# Patient Record
Sex: Female | Born: 1960 | Hispanic: No | Marital: Single | Smoking: Current every day smoker
Health system: Southern US, Community
[De-identification: ages and names within clinical notes are randomized; demographics above are authoritative.]

## PROBLEM LIST (undated history)

## (undated) DIAGNOSIS — E119 Type 2 diabetes mellitus without complications: Secondary | ICD-10-CM

## (undated) DIAGNOSIS — I639 Cerebral infarction, unspecified: Secondary | ICD-10-CM

## (undated) DIAGNOSIS — M549 Dorsalgia, unspecified: Secondary | ICD-10-CM

## (undated) DIAGNOSIS — K219 Gastro-esophageal reflux disease without esophagitis: Secondary | ICD-10-CM

## (undated) DIAGNOSIS — E7800 Pure hypercholesterolemia, unspecified: Secondary | ICD-10-CM

## (undated) DIAGNOSIS — I10 Essential (primary) hypertension: Secondary | ICD-10-CM

## (undated) DIAGNOSIS — G4730 Sleep apnea, unspecified: Secondary | ICD-10-CM

## (undated) DIAGNOSIS — E669 Obesity, unspecified: Secondary | ICD-10-CM

## (undated) DIAGNOSIS — G40909 Epilepsy, unspecified, not intractable, without status epilepticus: Secondary | ICD-10-CM

## (undated) HISTORY — PX: CARPAL TUNNEL RELEASE: SHX101

## (undated) HISTORY — PX: APPENDECTOMY: SHX54

## (undated) HISTORY — PX: BREAST REDUCTION SURGERY: SHX8

---

## 2003-12-15 ENCOUNTER — Emergency Department (HOSPITAL_COMMUNITY): Admission: EM | Admit: 2003-12-15 | Discharge: 2003-12-15 | Payer: Self-pay | Admitting: Emergency Medicine

## 2012-03-26 ENCOUNTER — Emergency Department (HOSPITAL_COMMUNITY)
Admission: EM | Admit: 2012-03-26 | Discharge: 2012-03-26 | Disposition: A | Discharge status: Home / Self Care | Payer: Self-pay | Attending: Emergency Medicine | Admitting: Emergency Medicine

## 2012-03-26 ENCOUNTER — Emergency Department (HOSPITAL_COMMUNITY): Payer: Self-pay

## 2012-03-26 ENCOUNTER — Encounter (HOSPITAL_COMMUNITY): Payer: Self-pay | Admitting: Emergency Medicine

## 2012-03-26 DIAGNOSIS — F17200 Nicotine dependence, unspecified, uncomplicated: Secondary | ICD-10-CM | POA: Insufficient documentation

## 2012-03-26 DIAGNOSIS — M549 Dorsalgia, unspecified: Secondary | ICD-10-CM | POA: Insufficient documentation

## 2012-03-26 DIAGNOSIS — I10 Essential (primary) hypertension: Secondary | ICD-10-CM | POA: Insufficient documentation

## 2012-03-26 DIAGNOSIS — R079 Chest pain, unspecified: Secondary | ICD-10-CM | POA: Insufficient documentation

## 2012-03-26 DIAGNOSIS — R091 Pleurisy: Secondary | ICD-10-CM | POA: Insufficient documentation

## 2012-03-26 HISTORY — DX: Gastro-esophageal reflux disease without esophagitis: K21.9

## 2012-03-26 HISTORY — DX: Essential (primary) hypertension: I10

## 2012-03-26 HISTORY — DX: Epilepsy, unspecified, not intractable, without status epilepticus: G40.909

## 2012-03-26 LAB — CBC WITH DIFFERENTIAL/PLATELET
Basophils Absolute: 0 10*3/uL (ref 0.0–0.1)
Basophils Relative: 0 % (ref 0–1)
Eosinophils Relative: 3 % (ref 0–5)
Hemoglobin: 11.6 g/dL — ABNORMAL LOW (ref 12.0–15.0)
Lymphocytes Relative: 32 % (ref 12–46)
Lymphs Abs: 2.8 10*3/uL (ref 0.7–4.0)
Monocytes Absolute: 0.7 10*3/uL (ref 0.1–1.0)
Neutro Abs: 5 10*3/uL (ref 1.7–7.7)
Neutrophils Relative %: 57 % (ref 43–77)
RDW: 19.1 % — ABNORMAL HIGH (ref 11.5–15.5)

## 2012-03-26 LAB — COMPREHENSIVE METABOLIC PANEL
ALT: 17 U/L (ref 0–35)
AST: 17 U/L (ref 0–37)
Albumin: 3.6 g/dL (ref 3.5–5.2)
CO2: 23 mEq/L (ref 19–32)
Calcium: 8.6 mg/dL (ref 8.4–10.5)
Creatinine, Ser: 0.6 mg/dL (ref 0.50–1.10)
Glucose, Bld: 92 mg/dL (ref 70–99)
Potassium: 4.2 mEq/L (ref 3.5–5.1)

## 2012-03-26 LAB — URINE MICROSCOPIC-ADD ON

## 2012-03-26 LAB — URINALYSIS, ROUTINE W REFLEX MICROSCOPIC
Ketones, ur: NEGATIVE mg/dL
Nitrite: NEGATIVE
Protein, ur: 100 mg/dL — AB
pH: 6 (ref 5.0–8.0)

## 2012-03-26 LAB — LIPASE, BLOOD: Lipase: 20 U/L (ref 11–59)

## 2012-03-26 LAB — POCT PREGNANCY, URINE: Preg Test, Ur: NEGATIVE

## 2012-03-26 MED ORDER — HYDROMORPHONE HCL PF 1 MG/ML IJ SOLN
1.0000 mg | INTRAMUSCULAR | Status: DC | PRN
  Administered 2012-03-26 (×2): 1 mg via INTRAVENOUS
  Filled 2012-03-26 (×2): qty 1

## 2012-03-26 MED ORDER — SODIUM CHLORIDE 0.9 % IV SOLN
1000.0000 mL | Freq: Once | INTRAVENOUS | Status: AC
  Administered 2012-03-26: 1000 mL via INTRAVENOUS

## 2012-03-26 MED ORDER — IOHEXOL 350 MG/ML SOLN
100.0000 mL | Freq: Once | INTRAVENOUS | Status: AC | PRN
  Administered 2012-03-26: 100 mL via INTRAVENOUS

## 2012-03-26 MED ORDER — SODIUM CHLORIDE 0.9 % IV SOLN
1000.0000 mL | INTRAVENOUS | Status: DC
  Administered 2012-03-26: 1000 mL via INTRAVENOUS

## 2012-03-26 MED ORDER — HYDROCODONE-ACETAMINOPHEN 5-325 MG PO TABS: 1.0000 | ORAL_TABLET | Freq: Four times a day (QID) | ORAL | Status: DC | PRN

## 2012-03-26 MED ORDER — PANTOPRAZOLE SODIUM 40 MG PO TBEC: 40.0000 mg | DELAYED_RELEASE_TABLET | Freq: Every day | ORAL | Status: DC

## 2012-03-26 MED ORDER — ONDANSETRON HCL 4 MG/2ML IJ SOLN
4.0000 mg | Freq: Once | INTRAMUSCULAR | Status: AC
  Administered 2012-03-26: 4 mg via INTRAVENOUS
  Filled 2012-03-26: qty 2

## 2012-03-26 NOTE — ED Notes (Signed)
IV team paged and responded to start IV for pt for CT Angio.

## 2012-03-26 NOTE — ED Notes (Signed)
Ultrasound at bedside with pt NAD noted at this time.

## 2012-03-26 NOTE — ED Notes (Signed)
Pt noted to be resting quietly in bed.  Equal chest rise and fall bilaterally. NAD noted.

## 2012-03-26 NOTE — ED Provider Notes (Signed)
History     CSN: [id number]  Arrival date & time 03/26/12  0830   First MD Initiated Contact with Patient 03/26/12 [phone]      Chief Complaint  Patient presents with  . Shortness of Breath  . Chest Pain  . Back Pain     Patient is a 51 y.o. female presenting with shortness of breath, chest pain, and back pain. The history is provided by the patient.  Shortness of Breath  The current episode started 3 to 5 days ago. The problem occurs continuously. The problem has been unchanged. The problem is moderate. Nothing (she has not tried any medications other than her acid reflux medications) relieves the symptoms. Associated symptoms include chest pain, cough and shortness of breath.  Chest Pain The quality of the pain is described as pleuritic and tightness. The pain radiates to the mid back and upper back. Chest pain is worsened by deep breathing. Primary symptoms include shortness of breath and cough. Pertinent negatives for primary symptoms include no abdominal pain.  Pertinent negatives for past medical history include no DVT, no MI and no PE.    Back Pain  Associated symptoms include chest pain. Pertinent negatives include no abdominal pain.  She has felt like she has had to burp.  She has been trying soda. Mom had cardiac disease.  Brother had cardiac disease.  Past Medical History  Diagnosis Date  . Epilepsy   . Acid reflux   . HTN (hypertension)     Past Surgical History  Procedure Date  . Appendectomy     History reviewed. No pertinent family history.  History  Substance Use Topics  . Smoking status: Current Every Day Smoker -- 0.5 packs/day  . Smokeless tobacco: Never Used  . Alcohol Use: Yes    OB History    Grav Para Term Preterm Abortions TAB SAB Ect Mult Living                  Review of Systems  Respiratory: Positive for cough and shortness of breath.   Cardiovascular: Positive for chest pain.  Gastrointestinal: Negative for abdominal pain.    Musculoskeletal: Positive for back pain.  All other systems reviewed and are negative.    Allergies  Penicillins  Home Medications  No current outpatient prescriptions on file.  BP 149/83  Pulse 67  Temp 97.4 F (36.3 C) (Oral)  Resp 18  SpO2 100%  LMP 03/26/2012  Physical Exam  Nursing note and vitals reviewed. Constitutional: She appears well-developed and well-nourished. No distress.  HENT:  Head: Normocephalic and atraumatic.  Right Ear: External ear normal.  Left Ear: External ear normal.  Eyes: Conjunctivae normal are normal. Right eye exhibits no discharge. Left eye exhibits no discharge. No scleral icterus.  Neck: Neck supple. No tracheal deviation present.  Cardiovascular: Normal rate, regular rhythm and intact distal pulses.   Pulmonary/Chest: Effort normal and breath sounds normal. No stridor. No respiratory distress. She has no wheezes. She has no rales.       Mild ttp parapsinal region  Abdominal: Soft. Bowel sounds are normal. She exhibits no distension and no mass. There is tenderness in the right upper quadrant and epigastric area. There is guarding and positive Murphy's sign. There is no rigidity and no rebound. No hernia.  Musculoskeletal: She exhibits no edema and no tenderness.  Neurological: She is alert. She has normal strength. No sensory deficit. Cranial nerve deficit:  no gross defecits noted. She exhibits normal muscle tone. She  displays no seizure activity. Coordination normal.  Skin: Skin is warm and dry. No rash noted.  Psychiatric: She has a normal mood and affect.    ED Course  Procedures (including critical care time) EKG Rate 61 SINUS OR ECTOPIC ATRIAL RHYTHM ~ P axis (-45,135) SHORT PR INTERVAL, ACCELERATED AV CONDUCTION ~ PR <173mS Nl axis, no st t wave changes No old ekg Labs Reviewed  URINALYSIS, ROUTINE W REFLEX MICROSCOPIC - Abnormal; Notable for the following:    Color, Urine RED (*)  BIOCHEMICALS MAY BE AFFECTED BY COLOR    APPearance CLOUDY (*)     Hgb urine dipstick LARGE (*)     Protein, ur 100 (*)     Leukocytes, UA SMALL (*)     All other components within normal limits  CBC WITH DIFFERENTIAL - Abnormal; Notable for the following:    Hemoglobin 11.6 (*)     HCT 34.8 (*)     MCV 74.0 (*)     MCH 24.7 (*)     RDW 19.1 (*)     Platelets 444 (*)     All other components within normal limits  URINE MICROSCOPIC-ADD ON - Abnormal; Notable for the following:    Squamous Epithelial / LPF FEW (*)     Bacteria, UA MANY (*)     All other components within normal limits  POCT PREGNANCY, URINE  COMPREHENSIVE METABOLIC PANEL  LIPASE, BLOOD   Dg Chest 2 View  03/26/2012  *RADIOLOGY REPORT*  Clinical Data: Shortness of breath, chest pain.  CHEST - 2 VIEW  Comparison: None  Findings: Heart is borderline in size.  Mild peribronchial thickening.  No confluent opacities or effusions.  No acute bony abnormality.  IMPRESSION: Borderline cardiomegaly.  Mild bronchitic changes.   Original Report Authenticated By: Cyndie Chime, M.D.    US Abdomen Complete  03/26/2012  *RADIOLOGY REPORT*  Clinical Data:  Chest pain, back pain, shortness of breath.  COMPLETE ABDOMINAL ULTRASOUND  Comparison:  None.  Findings:  Gallbladder:  No gallstones, gallbladder wall thickening, or pericholecystic fluid.  Common bile duct:   Within normal limits in caliber.  Liver:  No focal lesion identified.  Within normal limits in parenchymal echogenicity.  IVC:  Appears normal.  Pancreas:  No focal abnormality seen.  Spleen:  Within normal limits in size and echotexture.  Right Kidney:   Normal in size and parenchymal echogenicity.  No evidence of mass or hydronephrosis.  Left Kidney:  Normal in size and parenchymal echogenicity.  No evidence of mass or hydronephrosis.  Abdominal aorta:  No aneurysm identified.  IMPRESSION: Negative abdominal ultrasound.   Original Report Authenticated By: Cyndie Chime, M.D.      1. Pleurisy       MDM  The  patient has had an extensive workup in the emergency department. She has had sharp pleuritic chest pain. Was concerned about the possibility of acute cholecystitis considering her abdominal tenderness however the ultrasound and labs are unremarkable. The patient did mention having recent travel in the last month. Considering the degree of her discomfort in the pleuritic chest pain a CT scan was done to she has no evidence of dissection or pulmonary embolism.  At this point I suspect a pleurisy-type condition. I will discharge her home on oral pain medications. I also have her continue her antacid medications    Celene Kras, MD 03/26/12 [phone]

## 2012-03-26 NOTE — ED Notes (Signed)
IV team placed IV, pt ready for CT Angio

## 2012-03-26 NOTE — ED Notes (Signed)
Pt back from x-ray.

## 2012-03-26 NOTE — ED Notes (Signed)
Pt reports she is on her menstrual cycle. Urine red colored when collected.

## 2012-03-26 NOTE — ED Notes (Signed)
IV team at bedside 

## 2012-03-26 NOTE — ED Notes (Signed)
Pt states that last Saturday she began feeling sob due to pain in her ribs/back upon inspiration.  Thinks she may have worked too hard.

## 2012-03-26 NOTE — ED Notes (Addendum)
md at bedside  Pt alert and oriented x4. Respirations even and unlabored, bilateral symmetrical rise and fall of chest. Skin warm and dry. In no acute distress. Denies needs.   

## 2014-02-04 DIAGNOSIS — M5416 Radiculopathy, lumbar region: Secondary | ICD-10-CM | POA: Insufficient documentation

## 2014-02-04 DIAGNOSIS — M47816 Spondylosis without myelopathy or radiculopathy, lumbar region: Secondary | ICD-10-CM | POA: Insufficient documentation

## 2014-02-04 DIAGNOSIS — M545 Low back pain, unspecified: Secondary | ICD-10-CM | POA: Insufficient documentation

## 2014-02-04 DIAGNOSIS — M25561 Pain in right knee: Secondary | ICD-10-CM | POA: Insufficient documentation

## 2014-03-24 DIAGNOSIS — G5603 Carpal tunnel syndrome, bilateral upper limbs: Secondary | ICD-10-CM | POA: Insufficient documentation

## 2015-06-22 ENCOUNTER — Other Ambulatory Visit (HOSPITAL_COMMUNITY): Payer: Self-pay | Admitting: Podiatry

## 2015-06-22 DIAGNOSIS — M79671 Pain in right foot: Secondary | ICD-10-CM

## 2015-07-05 ENCOUNTER — Ambulatory Visit (HOSPITAL_COMMUNITY): Payer: Medicaid Other

## 2015-07-05 ENCOUNTER — Encounter (HOSPITAL_COMMUNITY): Payer: Medicaid Other

## 2015-07-22 ENCOUNTER — Encounter (HOSPITAL_COMMUNITY)
Admission: RE | Admit: 2015-07-22 | Discharge: 2015-07-22 | Disposition: A | Discharge status: Home / Self Care | Payer: Medicaid Other | Source: Ambulatory Visit | Attending: Podiatry | Admitting: Podiatry

## 2015-07-22 ENCOUNTER — Ambulatory Visit (HOSPITAL_COMMUNITY)
Admission: RE | Admit: 2015-07-22 | Discharge: 2015-07-22 | Disposition: A | Discharge status: Home / Self Care | Payer: Medicaid Other | Source: Ambulatory Visit | Attending: Podiatry | Admitting: Podiatry

## 2015-07-22 DIAGNOSIS — M79671 Pain in right foot: Secondary | ICD-10-CM | POA: Diagnosis not present

## 2015-07-22 DIAGNOSIS — R948 Abnormal results of function studies of other organs and systems: Secondary | ICD-10-CM | POA: Insufficient documentation

## 2015-07-22 MED ORDER — TECHNETIUM TC 99M MEDRONATE IV KIT: 25.0000 | PACK | Freq: Once | INTRAVENOUS | Status: DC | PRN

## 2015-08-18 ENCOUNTER — Other Ambulatory Visit: Payer: Self-pay | Admitting: Podiatry

## 2015-08-18 DIAGNOSIS — R229 Localized swelling, mass and lump, unspecified: Secondary | ICD-10-CM

## 2015-08-18 DIAGNOSIS — IMO0002 Reserved for concepts with insufficient information to code with codable children: Secondary | ICD-10-CM

## 2015-08-18 DIAGNOSIS — M79671 Pain in right foot: Secondary | ICD-10-CM

## 2015-08-30 ENCOUNTER — Other Ambulatory Visit: Payer: Medicaid Other

## 2015-08-30 DIAGNOSIS — I10 Essential (primary) hypertension: Secondary | ICD-10-CM | POA: Diagnosis present

## 2015-08-30 DIAGNOSIS — K2980 Duodenitis without bleeding: Secondary | ICD-10-CM | POA: Insufficient documentation

## 2015-08-30 DIAGNOSIS — E785 Hyperlipidemia, unspecified: Secondary | ICD-10-CM | POA: Diagnosis present

## 2015-08-30 DIAGNOSIS — K819 Cholecystitis, unspecified: Secondary | ICD-10-CM | POA: Insufficient documentation

## 2015-08-30 DIAGNOSIS — R569 Unspecified convulsions: Secondary | ICD-10-CM

## 2015-08-30 DIAGNOSIS — K921 Melena: Secondary | ICD-10-CM | POA: Insufficient documentation

## 2015-08-31 DIAGNOSIS — R1013 Epigastric pain: Secondary | ICD-10-CM | POA: Insufficient documentation

## 2015-09-14 ENCOUNTER — Other Ambulatory Visit: Payer: Medicaid Other

## 2017-06-01 ENCOUNTER — Other Ambulatory Visit: Payer: Self-pay

## 2017-06-01 ENCOUNTER — Emergency Department (HOSPITAL_BASED_OUTPATIENT_CLINIC_OR_DEPARTMENT_OTHER)
Admission: EM | Admit: 2017-06-01 | Discharge: 2017-06-01 | Disposition: A | Discharge status: Home / Self Care | Payer: Medicaid Other | Attending: Emergency Medicine | Admitting: Emergency Medicine

## 2017-06-01 ENCOUNTER — Encounter (HOSPITAL_BASED_OUTPATIENT_CLINIC_OR_DEPARTMENT_OTHER): Payer: Self-pay | Admitting: Emergency Medicine

## 2017-06-01 ENCOUNTER — Emergency Department (HOSPITAL_BASED_OUTPATIENT_CLINIC_OR_DEPARTMENT_OTHER): Payer: Medicaid Other

## 2017-06-01 DIAGNOSIS — R05 Cough: Secondary | ICD-10-CM | POA: Insufficient documentation

## 2017-06-01 DIAGNOSIS — R072 Precordial pain: Secondary | ICD-10-CM

## 2017-06-01 DIAGNOSIS — R001 Bradycardia, unspecified: Secondary | ICD-10-CM | POA: Diagnosis not present

## 2017-06-01 DIAGNOSIS — R0789 Other chest pain: Secondary | ICD-10-CM | POA: Insufficient documentation

## 2017-06-01 DIAGNOSIS — Z79899 Other long term (current) drug therapy: Secondary | ICD-10-CM | POA: Insufficient documentation

## 2017-06-01 DIAGNOSIS — I10 Essential (primary) hypertension: Secondary | ICD-10-CM | POA: Insufficient documentation

## 2017-06-01 DIAGNOSIS — R079 Chest pain, unspecified: Secondary | ICD-10-CM | POA: Diagnosis present

## 2017-06-01 DIAGNOSIS — R058 Other specified cough: Secondary | ICD-10-CM

## 2017-06-01 DIAGNOSIS — F17210 Nicotine dependence, cigarettes, uncomplicated: Secondary | ICD-10-CM | POA: Insufficient documentation

## 2017-06-01 LAB — BASIC METABOLIC PANEL
ANION GAP: 6 (ref 5–15)
BUN: 12 mg/dL (ref 6–20)
CHLORIDE: 102 mmol/L (ref 101–111)
CO2: 26 mmol/L (ref 22–32)
Calcium: 9.2 mg/dL (ref 8.9–10.3)
Creatinine, Ser: 0.77 mg/dL (ref 0.44–1.00)
GFR calc Af Amer: >60 mL/min (ref >60)
Glucose, Bld: 104 mg/dL — ABNORMAL HIGH (ref 65–99)
POTASSIUM: 3.9 mmol/L (ref 3.5–5.1)
SODIUM: 134 mmol/L — AB (ref 135–145)

## 2017-06-01 LAB — CBC
HEMATOCRIT: 40.2 % (ref 36.0–46.0)
HEMOGLOBIN: 13.6 g/dL (ref 12.0–15.0)
MCH: 27.1 pg (ref 26.0–34.0)
MCHC: 33.8 g/dL (ref 30.0–36.0)
MCV: 80.1 fL (ref 78.0–100.0)
Platelets: 215 10*3/uL (ref 150–400)
RBC: 5.02 MIL/uL (ref 3.87–5.11)
RDW: 14.9 % (ref 11.5–15.5)
WBC: 8.1 10*3/uL (ref 4.0–10.5)

## 2017-06-01 LAB — TROPONIN I
TROPONIN I: 0.04 ng/mL — AB (ref <0.03)
Troponin I: 0.04 ng/mL (ref <0.03)

## 2017-06-01 MED ORDER — IBUPROFEN 400 MG PO TABS
400.0000 mg | ORAL_TABLET | Freq: Once | ORAL | Status: AC
  Administered 2017-06-01: 400 mg via ORAL
  Filled 2017-06-01: qty 1

## 2017-06-01 NOTE — Discharge Instructions (Addendum)
It was our pleasure to provide your ER care today - we hope that you feel better.  Take acetaminophen or ibuprofen as need.   Your heart rate gets low at times (48) - this is likely due to one of your blood pressure medications (metoprolol) - decrease the dose of that medication by taking 1/2 tablet (12.5) mg once a day, instead of 1 tablet (25 mg).  For chest discomfort, follow up with cardiologist in the next 1-2 weeks - see referral - call office Monday to arrange follow up appointment.   Your blood pressure is high today - follow up with primary care doctor in 1-2 weeks.   Return to ER if worse, new symptoms, fevers, trouble breathing, persistent or recurrent chest pain, other concern.

## 2017-06-01 NOTE — ED Triage Notes (Signed)
Patient has had a cough x 1 week.  It is a productive cough with clear in color but in mornings it is brownish in color.   Denies any fever or chilis.  Taking OTC medicine Nyquil and Dayquil with some relief.   Patient denies getting flu vaccination for the year.    Pain is 8/10 on left chest area.  Chest pain x 3 days.  Pain does not radiate, it feels like a squeezing pain to chest.    Denies any blackouts but feels dizzy at times.  She states she has a hard time breathing.

## 2017-06-01 NOTE — ED Notes (Signed)
Troponin 0.04, results given to ED MD 

## 2017-06-01 NOTE — ED Provider Notes (Addendum)
[facility] EMERGENCY DEPARTMENT Provider Note   CSN: [id number] Arrival date & time: 06/01/17  1051     History   Chief Complaint Chief Complaint  Patient presents with  . Chest Pain  . Cough    HPI Gloria Hurst is a 56 y.o. female.  Patient c/o sharp pain to mid chest/midline in past 2-3 days. Pain occurs at rest. Constant. No relation to activity or exertion.  States had a bad cough last week, but that is improving. Denies associated sob, nv or diaphoresis. No pleuritic pain. No leg pain or swelling. No recent surgery, trauma or immobility. Denies recent unusual doe or fatigue. States mother had heart disease in her 25s. +smoker. States had eval for chest pain several yrs ago that was negative. No prior cath.  Denies heartburn. Denies chest wall injury or strain.   The history is provided by the patient.  Chest Pain   Associated symptoms include cough. Pertinent negatives include no abdominal pain, no back pain, no fever, no headaches, no shortness of breath and no vomiting.  Cough  Associated symptoms include chest pain. Pertinent negatives include no headaches, no sore throat, no shortness of breath and no eye redness.    Past Medical History:  Diagnosis Date  . Acid reflux   . Epilepsy (HCC)   . HTN (hypertension)     There are no active problems to display for this patient.   Past Surgical History:  Procedure Laterality Date  . APPENDECTOMY      OB History    No data available       Home Medications    Prior to Admission medications   Medication Sig Start Date End Date Taking? Authorizing Provider  acetaminophen (TYLENOL) 500 MG tablet Take 500 mg by mouth every 6 (six) hours as needed. pain    [provider]  dexlansoprazole (DEXILANT) 60 MG capsule Take 60 mg by mouth daily.    [provider]  HYDROcodone-acetaminophen (NORCO) 5-325 MG per tablet Take 1-2 tablets by mouth every 6 (six) hours as needed for pain. 03/26/12    Linwood Dibbles, MD  ibuprofen (ADVIL,MOTRIN) 200 MG tablet Take 400 mg by mouth every 6 (six) hours as needed. pain    [provider]    Family History History reviewed. No pertinent family history.  Social History Social History   Tobacco Use  . Smoking status: Current Every Day Smoker    Packs/day: 0.50  . Smokeless tobacco: Never Used  Substance Use Topics  . Alcohol use: Yes  . Drug use: No     Allergies   Penicillins   Review of Systems Review of Systems  Constitutional: Negative for fever.  HENT: Negative for sore throat.   Eyes: Negative for redness.  Respiratory: Positive for cough. Negative for shortness of breath.   Cardiovascular: Positive for chest pain.  Gastrointestinal: Negative for abdominal pain and vomiting.  Genitourinary: Negative for flank pain.  Musculoskeletal: Negative for back pain and neck pain.  Skin: Negative for rash.  Neurological: Negative for headaches.  Hematological: Does not bruise/bleed easily.  Psychiatric/Behavioral: Negative for confusion.     Physical Exam Updated Vital Signs BP (!) 188/92 (BP Location: Left Arm)   Pulse (!) 58   Temp 98.4 F (36.9 C) (Oral)   Resp 18   Ht 1.575 m (5\' 2" )   Wt 88 kg (194 lb)   LMP 03/26/2012   SpO2 100%   BMI 35.48 kg/m   Physical Exam  Constitutional: She appears well-developed and well-nourished. No distress.  HENT:  Mouth/Throat: Oropharynx is clear and moist.  Eyes: Conjunctivae are normal. No scleral icterus.  Neck: Neck supple. No tracheal deviation present.  Cardiovascular: Normal rate, regular rhythm, normal heart sounds and intact distal pulses. Exam reveals no gallop and no friction rub.  No murmur heard. Pulmonary/Chest: Effort normal. No respiratory distress. She exhibits tenderness.  Abdominal: Soft. Normal appearance and bowel sounds are normal. She exhibits no distension. There is no tenderness.  Musculoskeletal: She exhibits no edema or tenderness.    Neurological: She is alert.  Skin: Skin is warm and dry. No rash noted. She is not diaphoretic.  Psychiatric: She has a normal mood and affect.  Nursing note and vitals reviewed.    ED Treatments / Results  Labs (all labs ordered are listed, but only abnormal results are displayed) Results for orders placed or performed during the hospital encounter of 06/01/17  Basic metabolic panel  Result Value Ref Range   Sodium 134 (L) 135 - 145 mmol/L   Potassium 3.9 3.5 - 5.1 mmol/L   Chloride 102 101 - 111 mmol/L   CO2 26 22 - 32 mmol/L   Glucose, Bld 104 (H) 65 - 99 mg/dL   BUN 12 6 - 20 mg/dL   Creatinine, Ser 1.470.77 0.44 - 1.00 mg/dL   Calcium 9.2 8.9 - 82.910.3 mg/dL   GFR calc non Af Amer >60 >60 mL/min   GFR calc Af Amer >60 >60 mL/min   Anion gap 6 5 - 15  CBC  Result Value Ref Range   WBC 8.1 4.0 - 10.5 K/uL   RBC 5.02 3.87 - 5.11 MIL/uL   Hemoglobin 13.6 12.0 - 15.0 g/dL   HCT 56.240.2 13.036.0 - 86.546.0 %   MCV 80.1 78.0 - 100.0 fL   MCH 27.1 26.0 - 34.0 pg   MCHC 33.8 30.0 - 36.0 g/dL   RDW 78.414.9 69.611.5 - 29.515.5 %   Platelets 215 150 - 400 K/uL  Troponin I  Result Value Ref Range   Troponin I 0.04 (HH) <0.03 ng/mL  Troponin I  Result Value Ref Range   Troponin I 0.04 (HH) <0.03 ng/mL    EKG  EKG Interpretation  Date/Time:  Saturday June 01 2017 11:01:34 EST Ventricular Rate:  55 PR Interval:    QRS Duration: 88 QT Interval:  441 QTC Calculation: 422 R Axis:   64 Text Interpretation:  Sinus rhythm Nonspecific T wave abnormality Baseline wander Confirmed by Cathren LaineSteinl, Demesha Boorman ([id number]) on 06/01/2017 11:13:18 AM       Radiology Dg Chest 2 View  Result Date: 06/01/2017 CLINICAL DATA:  Left upper anterior chest pain, midsternal pain. EXAM: CHEST  2 VIEW COMPARISON:  08/30/2015 FINDINGS: Heart is mildly enlarged. Lungs are clear. No effusions or edema. No acute bony abnormality. IMPRESSION: Cardiomegaly.  No active disease. Electronically Signed   By: Charlett NoseKevin  Dover M.D.   On:  06/01/2017 11:47    Procedures Procedures (including critical care time)  Medications Ordered in ED Medications - No data to display   Initial Impression / Assessment and Plan / ED Course  I have reviewed the triage vital signs and the nursing notes.  Pertinent labs & imaging results that were available during my care of the patient were reviewed by me and considered in my medical decision making (see chart for details).  Iv ns. Labs. Cxr. Ecg.  Reviewed nursing notes and prior charts for additional history.   Motrin po.  After symptoms for past 2-3 days, delta trop negative.   Given recent symptoms, although atypical, given fam hx, will refer to cardiology outpt f/u, possible outpt stress test/further eval.  pts heart rate is low at times (46-48), sinus.  Pt notes occasional mild lightheadedness w standing.  Pt is taking losartan/hctz and metoprolol 25 mg for blood pressure - will decrease bblocker dose in 1/2, and have f/u closely as outpt.  Current hr 58.    Recheck pt - pt is symptom free, and appears stable for d/c.     Final Clinical Impressions(s) / ED Diagnoses   Final diagnoses:  None    ED Discharge Orders    None           Cathren LaineSteinl, Cresta Riden, MD 06/01/17 1529

## 2017-06-01 NOTE — ED Notes (Signed)
Patient transported to X-ray 

## 2017-06-21 ENCOUNTER — Encounter (HOSPITAL_BASED_OUTPATIENT_CLINIC_OR_DEPARTMENT_OTHER): Payer: Self-pay | Admitting: Emergency Medicine

## 2017-06-21 ENCOUNTER — Other Ambulatory Visit: Payer: Self-pay

## 2017-06-21 ENCOUNTER — Emergency Department (HOSPITAL_BASED_OUTPATIENT_CLINIC_OR_DEPARTMENT_OTHER)
Admission: EM | Admit: 2017-06-21 | Discharge: 2017-06-21 | Disposition: A | Discharge status: Home / Self Care | Payer: Medicaid Other | Attending: Emergency Medicine | Admitting: Emergency Medicine

## 2017-06-21 DIAGNOSIS — Z79899 Other long term (current) drug therapy: Secondary | ICD-10-CM | POA: Insufficient documentation

## 2017-06-21 DIAGNOSIS — L03113 Cellulitis of right upper limb: Secondary | ICD-10-CM | POA: Diagnosis not present

## 2017-06-21 DIAGNOSIS — I10 Essential (primary) hypertension: Secondary | ICD-10-CM | POA: Diagnosis not present

## 2017-06-21 DIAGNOSIS — F17210 Nicotine dependence, cigarettes, uncomplicated: Secondary | ICD-10-CM | POA: Diagnosis not present

## 2017-06-21 DIAGNOSIS — M25521 Pain in right elbow: Secondary | ICD-10-CM | POA: Diagnosis present

## 2017-06-21 HISTORY — DX: Dorsalgia, unspecified: M54.9

## 2017-06-21 HISTORY — DX: Obesity, unspecified: E66.9

## 2017-06-21 HISTORY — DX: Pure hypercholesterolemia, unspecified: E78.00

## 2017-06-21 MED ORDER — DOXYCYCLINE HYCLATE 100 MG PO CAPS: 100.0000 mg | ORAL_CAPSULE | Freq: Two times a day (BID) | ORAL | 0 refills | Status: DC

## 2017-06-21 MED ORDER — IBUPROFEN 600 MG PO TABS: 600.0000 mg | ORAL_TABLET | Freq: Four times a day (QID) | ORAL | 0 refills | Status: DC | PRN

## 2017-06-21 NOTE — ED Triage Notes (Signed)
Redness and swelling proximal to left elbow since yesterday.  Sts she felt something bite her but did not see what it was.

## 2017-06-21 NOTE — ED Provider Notes (Signed)
[facility] EMERGENCY DEPARTMENT Provider Note   CSN: [id number] Arrival date & time: 06/21/17  1547     History   Chief Complaint Chief Complaint  Patient presents with  . Insect Bite    HPI Gloria Hurst is a 56 y.o. female.  HPI   56 year old female with history of hypertension, hypercholesterolemia, back pain, obesity presenting complaining of right elbow pain.  Patient report yesterday afternoon while sitting at home she felt something bit her on her right elbow and upper arm.  She immediately experiencing a stinging sensation but did not see any specific insect.  Since then she noticed increasing throbbing pain, with redness and warmth that has spread throughout the back of her upper arm.  Pain is moderate in severity and nonradiating.  She has tried using hydrocortisone cream, and Benadryl without relief.  No report of fever, chills, chest pain or shortness of breath.  No throat swelling or trouble swallowing.  No history of diabetes.  Does have history of hypertension and her PCP recently decrease her blood pressure medication dosage.  Past Medical History:  Diagnosis Date  . Acid reflux   . Back pain   . Epilepsy (HCC)   . High cholesterol   . HTN (hypertension)   . Obesity     There are no active problems to display for this patient.   Past Surgical History:  Procedure Laterality Date  . APPENDECTOMY    . BREAST REDUCTION SURGERY    . CARPAL TUNNEL RELEASE    . CESAREAN SECTION      OB History    No data available       Home Medications    Prior to Admission medications   Medication Sig Start Date End Date Taking? Authorizing Provider  acetaminophen (TYLENOL) 500 MG tablet Take 500 mg by mouth every 6 (six) hours as needed. pain    [provider]  dexlansoprazole (DEXILANT) 60 MG capsule Take 60 mg by mouth daily.    [provider]  HYDROcodone-acetaminophen (NORCO) 5-325 MG per tablet Take 1-2 tablets by mouth every 6  (six) hours as needed for pain. 03/26/12   Linwood DibblesKnapp, Jon, MD  ibuprofen (ADVIL,MOTRIN) 200 MG tablet Take 400 mg by mouth every 6 (six) hours as needed. pain    [provider]    Family History No family history on file.  Social History Social History   Tobacco Use  . Smoking status: Current Every Day Smoker    Packs/day: 0.50  . Smokeless tobacco: Never Used  Substance Use Topics  . Alcohol use: Yes    Comment: occ  . Drug use: No     Allergies   Penicillins   Review of Systems Review of Systems  Constitutional: Negative for fever.  Skin: Positive for rash.     Physical Exam Updated Vital Signs BP (!) 179/104 (BP Location: Left Arm)   Pulse 79   Temp 98.4 F (36.9 C) (Oral)   Resp 16   Ht 5\' 2"  (1.575 m)   Wt 88 kg (194 lb)   LMP 03/26/2012   SpO2 100%   BMI 35.48 kg/m   Physical Exam  Constitutional: She appears well-developed and well-nourished. No distress.  HENT:  Head: Atraumatic.  Eyes: Conjunctivae are normal.  Neck: Neck supple.  Neurological: She is alert.  Skin: Rash (Right upper arm: There is an area of induration, warmth, and erythema approximately 3 x 4 cm to the posterior aspect of the distal upper arm  without elbow involvement.  Area is tender to palpation but no fluctuant.  No petechial, vesicular, or pustular les) noted.  Psychiatric: She has a normal mood and affect.  Nursing note and vitals reviewed.    ED Treatments / Results  Labs (all labs ordered are listed, but only abnormal results are displayed) Labs Reviewed - No data to display  EKG  EKG Interpretation None       Radiology No results found.  Procedures Procedures (including critical care time)  Medications Ordered in ED Medications - No data to display   Initial Impression / Assessment and Plan / ED Course  I have reviewed the triage vital signs and the nursing notes.  Pertinent labs & imaging results that were available during my care of the  patient were reviewed by me and considered in my medical decision making (see chart for details).     BP (!) 175/94 (BP Location: Left Arm)   Pulse 79   Temp 98.4 F (36.9 C) (Oral)   Resp 18   Ht 5\' 2"  (1.575 m)   Wt 88 kg (194 lb)   LMP 03/26/2012   SpO2 95%   BMI 35.48 kg/m    Final Clinical Impressions(s) / ED Diagnoses   Final diagnoses:  Cellulitis of right upper arm    ED Discharge Orders        Ordered    ibuprofen (ADVIL,MOTRIN) 600 MG tablet  Every 6 hours PRN     06/21/17 1750    doxycycline (VIBRAMYCIN) 100 MG capsule  2 times daily     06/21/17 1750     5:48 PM Patient here for potential insect bite to her right upper arm near her elbow posteriorly.  Area does appears to be a localized skin irritation from a likely insect bite.  This can potentially lead to cellulitis.  Given the pain and warmth and erythema, patient will be discharged home with doxycycline to cover for potential skin infection.  She is allergic to penicillin.  She is found to be hypertensive, recommend follow-up with PCP for blood pressure recheck.  No evidence of abscess currently.   Fayrene Helperran, Nezzie Manera, PA-C 06/21/17 1750    Alvira MondaySchlossman, Erin, MD 06/22/17 [phone]

## 2017-09-02 ENCOUNTER — Other Ambulatory Visit: Payer: Self-pay

## 2017-09-02 ENCOUNTER — Emergency Department (HOSPITAL_BASED_OUTPATIENT_CLINIC_OR_DEPARTMENT_OTHER)
Admission: EM | Admit: 2017-09-02 | Discharge: 2017-09-02 | Disposition: A | Discharge status: Home / Self Care | Payer: Medicaid Other | Attending: Emergency Medicine | Admitting: Emergency Medicine

## 2017-09-02 ENCOUNTER — Encounter (HOSPITAL_BASED_OUTPATIENT_CLINIC_OR_DEPARTMENT_OTHER): Payer: Self-pay | Admitting: *Deleted

## 2017-09-02 ENCOUNTER — Emergency Department (HOSPITAL_BASED_OUTPATIENT_CLINIC_OR_DEPARTMENT_OTHER): Payer: Medicaid Other

## 2017-09-02 DIAGNOSIS — R197 Diarrhea, unspecified: Secondary | ICD-10-CM | POA: Diagnosis not present

## 2017-09-02 DIAGNOSIS — M791 Myalgia, unspecified site: Secondary | ICD-10-CM | POA: Insufficient documentation

## 2017-09-02 DIAGNOSIS — R1110 Vomiting, unspecified: Secondary | ICD-10-CM

## 2017-09-02 DIAGNOSIS — R6889 Other general symptoms and signs: Secondary | ICD-10-CM

## 2017-09-02 DIAGNOSIS — F17210 Nicotine dependence, cigarettes, uncomplicated: Secondary | ICD-10-CM | POA: Diagnosis not present

## 2017-09-02 DIAGNOSIS — I10 Essential (primary) hypertension: Secondary | ICD-10-CM | POA: Diagnosis not present

## 2017-09-02 DIAGNOSIS — Z79899 Other long term (current) drug therapy: Secondary | ICD-10-CM | POA: Insufficient documentation

## 2017-09-02 DIAGNOSIS — R1013 Epigastric pain: Secondary | ICD-10-CM | POA: Insufficient documentation

## 2017-09-02 LAB — CBC WITH DIFFERENTIAL/PLATELET
BASOS PCT: 0 %
Basophils Absolute: 0 10*3/uL (ref 0.0–0.1)
Eosinophils Absolute: 0.2 10*3/uL (ref 0.0–0.7)
Eosinophils Relative: 2 %
HCT: 40.1 % (ref 36.0–46.0)
HEMOGLOBIN: 13.6 g/dL (ref 12.0–15.0)
LYMPHS ABS: 0.6 10*3/uL — AB (ref 0.7–4.0)
LYMPHS PCT: 5 %
MCH: 27 pg (ref 26.0–34.0)
MCHC: 33.9 g/dL (ref 30.0–36.0)
MCV: 79.6 fL (ref 78.0–100.0)
MONOS PCT: 5 %
Monocytes Absolute: 0.5 10*3/uL (ref 0.1–1.0)
NEUTROS PCT: 88 %
Neutro Abs: 9.9 10*3/uL — ABNORMAL HIGH (ref 1.7–7.7)
Platelets: 212 10*3/uL (ref 150–400)
RBC: 5.04 MIL/uL (ref 3.87–5.11)
RDW: 15.9 % — ABNORMAL HIGH (ref 11.5–15.5)
WBC: 11.2 10*3/uL — ABNORMAL HIGH (ref 4.0–10.5)

## 2017-09-02 LAB — COMPREHENSIVE METABOLIC PANEL
ALBUMIN: 4 g/dL (ref 3.5–5.0)
ALT: 20 U/L (ref 14–54)
ANION GAP: 10 (ref 5–15)
AST: 24 U/L (ref 15–41)
Alkaline Phosphatase: 84 U/L (ref 38–126)
BILIRUBIN TOTAL: 0.5 mg/dL (ref 0.3–1.2)
BUN: 17 mg/dL (ref 6–20)
CALCIUM: 9.1 mg/dL (ref 8.9–10.3)
CO2: 24 mmol/L (ref 22–32)
Chloride: 101 mmol/L (ref 101–111)
Creatinine, Ser: 0.65 mg/dL (ref 0.44–1.00)
GFR calc Af Amer: >60 mL/min (ref >60)
GFR calc non Af Amer: >60 mL/min (ref >60)
GLUCOSE: 125 mg/dL — AB (ref 65–99)
Potassium: 3.8 mmol/L (ref 3.5–5.1)
Sodium: 135 mmol/L (ref 135–145)
Total Protein: 7.5 g/dL (ref 6.5–8.1)

## 2017-09-02 LAB — LIPASE, BLOOD: Lipase: 30 U/L (ref 11–51)

## 2017-09-02 MED ORDER — ONDANSETRON 4 MG PO TBDP: 4.0000 mg | ORAL_TABLET | Freq: Three times a day (TID) | ORAL | 0 refills | Status: DC | PRN

## 2017-09-02 MED ORDER — ONDANSETRON HCL 4 MG/2ML IJ SOLN
4.0000 mg | Freq: Once | INTRAMUSCULAR | Status: AC
  Administered 2017-09-02: 4 mg via INTRAVENOUS

## 2017-09-02 MED ORDER — SODIUM CHLORIDE 0.9 % IV BOLUS (SEPSIS)
1000.0000 mL | Freq: Once | INTRAVENOUS | Status: AC
  Administered 2017-09-02: 1000 mL via INTRAVENOUS

## 2017-09-02 MED ORDER — OSELTAMIVIR PHOSPHATE 75 MG PO CAPS: 75.0000 mg | ORAL_CAPSULE | Freq: Two times a day (BID) | ORAL | 0 refills | Status: DC

## 2017-09-02 MED ORDER — ONDANSETRON HCL 4 MG/2ML IJ SOLN
INTRAMUSCULAR | Status: AC
  Filled 2017-09-02: qty 2

## 2017-09-02 NOTE — ED Triage Notes (Signed)
Pt reports body aches, n/v x 4 and diarrhea x 3 today.

## 2017-09-02 NOTE — Discharge Instructions (Signed)
You were seen today for vomiting and diarrhea.  He likely have a virus.  This could be the flu.  You were offered Tamiflu.  Take ibuprofen for any chills or fevers.  Make sure to stay hydrated.

## 2017-09-02 NOTE — ED Provider Notes (Signed)
[facility] EMERGENCY DEPARTMENT Provider Note   CSN: [id number] Arrival date & time: 09/02/17  0010     History   Chief Complaint Chief Complaint  Patient presents with  . Emesis    HPI Gloria Hurst is a 57 y.o. female.  HPI  This is a 57 year old female with a history of hypertension, high cholesterol, epilepsy who presents with vomiting and diarrhea.  Patient reports 1 day history of myalgias, nonbilious, nonbloody emesis and nonbloody diarrhea.  No known sick contacts.  No documented fevers at home.  She states she has been unable to keep anything down.  She reports crampy abdominal pain that does not radiate.  Currently her discomfort is 4 out of 10.  She denies any urinary symptoms.  He does report some shortness of breath.  No cough.  Past Medical History:  Diagnosis Date  . Acid reflux   . Back pain   . Epilepsy (HCC)   . High cholesterol   . HTN (hypertension)   . Obesity     There are no active problems to display for this patient.   Past Surgical History:  Procedure Laterality Date  . APPENDECTOMY    . BREAST REDUCTION SURGERY    . CARPAL TUNNEL RELEASE    . CESAREAN SECTION      OB History    No data available       Home Medications    Prior to Admission medications   Medication Sig Start Date End Date Taking? Authorizing Provider  dexlansoprazole (DEXILANT) 60 MG capsule Take 60 mg by mouth daily.   Yes [provider]  acetaminophen (TYLENOL) 500 MG tablet Take 500 mg by mouth every 6 (six) hours as needed. pain    [provider]  doxycycline (VIBRAMYCIN) 100 MG capsule Take 1 capsule (100 mg total) by mouth 2 (two) times daily. One po bid x 7 days 06/21/17   Fayrene Helper, PA-C  HYDROcodone-acetaminophen [facility]) 5-325 MG per tablet Take 1-2 tablets by mouth every 6 (six) hours as needed for pain. 03/26/12   Linwood Dibbles, MD  ibuprofen (ADVIL,MOTRIN) 600 MG tablet Take 1 tablet (600 mg total) by mouth every 6 (six) hours  as needed for moderate pain. pain 06/21/17   Fayrene Helper, PA-C  ondansetron (ZOFRAN ODT) 4 MG disintegrating tablet Take 1 tablet (4 mg total) by mouth every 8 (eight) hours as needed for nausea or vomiting. 09/02/17   Letroy Vazguez, Mayer Masker, MD  oseltamivir (TAMIFLU) 75 MG capsule Take 1 capsule (75 mg total) by mouth every 12 (twelve) hours. 09/02/17   Ronnald Shedden, Mayer Masker, MD    Family History No family history on file.  Social History Social History   Tobacco Use  . Smoking status: Current Every Day Smoker    Packs/day: 0.50    Types: Cigarettes  . Smokeless tobacco: Never Used  Substance Use Topics  . Alcohol use: Yes    Comment: denies  . Drug use: No     Allergies   Penicillins   Review of Systems Review of Systems  Constitutional: Negative for chills and fever.  HENT: Negative for congestion.   Respiratory: Positive for shortness of breath.   Cardiovascular: Negative for chest pain.  Gastrointestinal: Positive for abdominal pain, diarrhea, nausea and vomiting.  Genitourinary: Negative for dysuria and hematuria.  Musculoskeletal: Positive for myalgias.  Neurological: Negative for dizziness.  All other systems reviewed and are negative.    Physical Exam Updated Vital Signs BP (!) 161/95 (BP  Location: Left Arm)   Pulse (!) 108   Temp 99.7 F (37.6 C) (Oral)   Resp (!) 22   LMP 03/26/2012   SpO2 97%   Physical Exam  Constitutional: She is oriented to person, place, and time.  Ill-appearing but nontoxic, no acute distress  HENT:  Head: Normocephalic and atraumatic.  Mucous membranes dry  Eyes: Pupils are equal, round, and reactive to light.  Cardiovascular: Regular rhythm and normal heart sounds.  Tachycardia  Pulmonary/Chest: Effort normal and breath sounds normal. No respiratory distress. She has no wheezes.  Abdominal: Soft. Bowel sounds are normal. There is tenderness. There is no rebound and no guarding.  Epigastric tenderness to palpation without rebound  or guarding  Neurological: She is alert and oriented to person, place, and time.  Skin: Skin is warm and dry.  Psychiatric: She has a normal mood and affect.  Nursing note and vitals reviewed.    ED Treatments / Results  Labs (all labs ordered are listed, but only abnormal results are displayed) Labs Reviewed  CBC WITH DIFFERENTIAL/PLATELET - Abnormal; Notable for the following components:      Result Value   WBC 11.2 (*)    RDW 15.9 (*)    Neutro Abs 9.9 (*)    Lymphs Abs 0.6 (*)    All other components within normal limits  COMPREHENSIVE METABOLIC PANEL - Abnormal; Notable for the following components:   Glucose, Bld 125 (*)    All other components within normal limits  LIPASE, BLOOD    EKG  EKG Interpretation  Date/Time:  Monday September 02 2017 01:22:43 EST Ventricular Rate:  97 PR Interval:    QRS Duration: 83 QT Interval:  344 QTC Calculation: 437 R Axis:   66 Text Interpretation:  Sinus rhythm Probable left atrial enlargement Borderline repolarization abnormality Confirmed by Ross Marcus ([id number]) on 09/02/2017 2:03:14 AM       Radiology Dg Abdomen Acute W/chest  Result Date: 09/02/2017 CLINICAL DATA:  Cough congestion nausea and vomiting EXAM: DG ABDOMEN ACUTE W/ 1V CHEST COMPARISON:  06/01/2017 FINDINGS: Single-view chest demonstrates no focal opacity or effusion. Normal heart size. Supine and upright views of the abdomen demonstrate no free air. Surgical clips in the right upper quadrant. Scattered fluid levels within the central bowel with diffuse decreased bowel gas. Calcified phleboliths in the pelvis. IMPRESSION: 1. No radiographic evidence for acute cardiopulmonary abnormality 2. Diffuse nonspecific decreased bowel gas. Electronically Signed   By: Jasmine Pang M.D.   On: 09/02/2017 01:53    Procedures Procedures (including critical care time)  Medications Ordered in ED Medications  ondansetron (ZOFRAN) 4 MG/2ML injection (not administered)  sodium  chloride 0.9 % bolus 1,000 mL (1,000 mLs Intravenous New Bag/Given 09/02/17 0102)  ondansetron (ZOFRAN) injection 4 mg (4 mg Intravenous Given 09/02/17 0102)     Initial Impression / Assessment and Plan / ED Course  I have reviewed the triage vital signs and the nursing notes.  Pertinent labs & imaging results that were available during my care of the patient were reviewed by me and considered in my medical decision making (see chart for details).     Patient presents with nausea, vomiting, diarrhea.  Ill-appearing but nontoxic.  Mildly tachycardic to 103.  Temperature 99.7 upon arrival.  She is actively vomiting.  She has mild epigastric tenderness without any signs of peritonitis.  Basic lab work obtained to evaluate for pancreatitis or elevated liver enzymes.  Suspect viral etiology.  Patient has a mild leukocytosis with  a left shift.  Otherwise her workup is largely reassuring.  No evidence of pneumonia.  EKG shows no evidence of arrhythmia.  Patient much improved after fluids and Zofran.  She is able to orally hydrate.  Suspect viral etiology or early flu.  Patient was offered Tamiflu, Zofran and.  Recommend Tylenol or ibuprofen for any body aches or pains or fevers.  After history, exam, and medical workup I feel the patient has been appropriately medically screened and is safe for discharge home. Pertinent diagnoses were discussed with the patient. Patient was given return precautions.   Final Clinical Impressions(s) / ED Diagnoses   Final diagnoses:  Flu-like symptoms  Vomiting and diarrhea    ED Discharge Orders        Ordered    ondansetron (ZOFRAN ODT) 4 MG disintegrating tablet  Every 8 hours PRN     09/02/17 0246    oseltamivir (TAMIFLU) 75 MG capsule  Every 12 hours     09/02/17 0246       Shon BatonHorton, Shilo Pauwels F, MD 09/02/17 [phone]

## 2017-09-02 NOTE — ED Notes (Addendum)
Alert, NAD, calm, interactive, resps e/u, speaking in clear complete sentences, no dyspnea noted, skin W&D, c/o abd pain, NV, pt "blowing nose at this time", nasal congestion noted. [facility] R wrist splint in place.

## 2017-09-23 ENCOUNTER — Other Ambulatory Visit: Payer: Self-pay

## 2017-09-23 ENCOUNTER — Encounter (HOSPITAL_BASED_OUTPATIENT_CLINIC_OR_DEPARTMENT_OTHER): Payer: Self-pay | Admitting: *Deleted

## 2017-09-23 DIAGNOSIS — I10 Essential (primary) hypertension: Secondary | ICD-10-CM | POA: Diagnosis not present

## 2017-09-23 DIAGNOSIS — Z79899 Other long term (current) drug therapy: Secondary | ICD-10-CM | POA: Insufficient documentation

## 2017-09-23 DIAGNOSIS — L989 Disorder of the skin and subcutaneous tissue, unspecified: Secondary | ICD-10-CM | POA: Diagnosis present

## 2017-09-23 DIAGNOSIS — W57XXXA Bitten or stung by nonvenomous insect and other nonvenomous arthropods, initial encounter: Secondary | ICD-10-CM | POA: Diagnosis not present

## 2017-09-23 DIAGNOSIS — F17210 Nicotine dependence, cigarettes, uncomplicated: Secondary | ICD-10-CM | POA: Insufficient documentation

## 2017-09-23 NOTE — ED Triage Notes (Signed)
States she was bit by a spider this afternoon. Swelling, redness, itching, hot, painful left upper arm.

## 2017-09-24 ENCOUNTER — Emergency Department (HOSPITAL_BASED_OUTPATIENT_CLINIC_OR_DEPARTMENT_OTHER)
Admission: EM | Admit: 2017-09-24 | Discharge: 2017-09-24 | Disposition: A | Discharge status: Home / Self Care | Payer: Medicaid Other | Attending: Emergency Medicine | Admitting: Emergency Medicine

## 2017-09-24 DIAGNOSIS — W57XXXA Bitten or stung by nonvenomous insect and other nonvenomous arthropods, initial encounter: Secondary | ICD-10-CM

## 2017-09-24 MED ORDER — DIPHENHYDRAMINE HCL 25 MG PO CAPS
25.0000 mg | ORAL_CAPSULE | Freq: Once | ORAL | Status: AC
  Administered 2017-09-24: 25 mg via ORAL
  Filled 2017-09-24: qty 1

## 2017-09-24 MED ORDER — DOXYCYCLINE HYCLATE 100 MG PO TABS
100.0000 mg | ORAL_TABLET | Freq: Once | ORAL | Status: AC
  Administered 2017-09-24: 100 mg via ORAL
  Filled 2017-09-24: qty 1

## 2017-09-24 MED ORDER — HYDROCORTISONE 1 % EX CREA: TOPICAL_CREAM | CUTANEOUS | 0 refills | Status: DC

## 2017-09-24 MED ORDER — DOXYCYCLINE HYCLATE 100 MG PO CAPS: 100.0000 mg | ORAL_CAPSULE | Freq: Two times a day (BID) | ORAL | 0 refills | Status: DC

## 2017-09-24 NOTE — Discharge Instructions (Addendum)
Take medications as prescribed.  Follow-up with your doctor.  Return to the ED if you develop new or worsening symptoms.

## 2017-09-24 NOTE — ED Provider Notes (Signed)
[facility] EMERGENCY DEPARTMENT Provider Note   CSN: [id number] Arrival date & time: 09/23/17  1958     History   Chief Complaint Chief Complaint  Patient presents with  . Insect Bite    HPI Gloria Hurst is a 57 y.o. female.  Patient complains of possible bug bite to her left posterior arm.  Believes she was bit by something around 1 PM.  She felt pain followed by redness, swelling and itching.  No one saw the bug bite or what bit or stung her.  She took some Benadryl at home with partial relief.  Denies fever, chills, nausea or vomiting.  No chest pain or shortness of breath.  No difficulty breathing or difficulty swallowing.  The history is provided by the patient.    Past Medical History:  Diagnosis Date  . Acid reflux   . Back pain   . Epilepsy (HCC)   . High cholesterol   . HTN (hypertension)   . Obesity     There are no active problems to display for this patient.   Past Surgical History:  Procedure Laterality Date  . APPENDECTOMY    . BREAST REDUCTION SURGERY    . CARPAL TUNNEL RELEASE    . CESAREAN SECTION       OB History   None      Home Medications    Prior to Admission medications   Medication Sig Start Date End Date Taking? Authorizing Provider  dexlansoprazole (DEXILANT) 60 MG capsule Take 60 mg by mouth daily.   Yes [provider]  Phenytoin (DILANTIN PO) Take by mouth.   Yes [provider]  Rosuvastatin Calcium (CRESTOR PO) Take by mouth.   Yes [provider]  acetaminophen (TYLENOL) 500 MG tablet Take 500 mg by mouth every 6 (six) hours as needed. pain    [provider]  doxycycline (VIBRAMYCIN) 100 MG capsule Take 1 capsule (100 mg total) by mouth 2 (two) times daily. One po bid x 7 days 06/21/17   Fayrene Helper, PA-C  HYDROcodone-acetaminophen [facility]) 5-325 MG per tablet Take 1-2 tablets by mouth every 6 (six) hours as needed for pain. 03/26/12   Linwood Dibbles, MD  ibuprofen (ADVIL,MOTRIN)  600 MG tablet Take 1 tablet (600 mg total) by mouth every 6 (six) hours as needed for moderate pain. pain 06/21/17   Fayrene Helper, PA-C  ondansetron (ZOFRAN ODT) 4 MG disintegrating tablet Take 1 tablet (4 mg total) by mouth every 8 (eight) hours as needed for nausea or vomiting. 09/02/17   Horton, Mayer Masker, MD  oseltamivir (TAMIFLU) 75 MG capsule Take 1 capsule (75 mg total) by mouth every 12 (twelve) hours. 09/02/17   Horton, Mayer Masker, MD    Family History No family history on file.  Social History Social History   Tobacco Use  . Smoking status: Current Every Day Smoker    Packs/day: 0.50    Types: Cigarettes  . Smokeless tobacco: Never Used  Substance Use Topics  . Alcohol use: Yes    Comment: denies  . Drug use: No     Allergies   Penicillins   Review of Systems Review of Systems  Constitutional: Negative for activity change, appetite change and fever.  Respiratory: Negative for cough, chest tightness and shortness of breath.   Cardiovascular: Negative for chest pain.  Gastrointestinal: Negative for abdominal pain, nausea and vomiting.  Genitourinary: Negative for dysuria and hematuria.  Musculoskeletal: Negative for arthralgias.  Skin: Positive for wound.  Neurological:  Negative for dizziness and seizures.    all other systems are negative except as noted in the HPI and PMH.    Physical Exam Updated Vital Signs BP (!) 188/96 (BP Location: Right Arm)   Pulse 66   Temp 98.1 F (36.7 C) (Oral)   Resp 16   Ht 5\' 2"  (1.575 m)   Wt 88 kg (194 lb)   LMP 03/26/2012   SpO2 98%   BMI 35.48 kg/m   Physical Exam  Constitutional: She is oriented to person, place, and time. She appears well-developed and well-nourished. No distress.  HENT:  Head: Normocephalic and atraumatic.  Mouth/Throat: Oropharynx is clear and moist. No oropharyngeal exudate.  Eyes: Pupils are equal, round, and reactive to light. Conjunctivae and EOM are normal.  Neck: Normal range of motion.  Neck supple.  No meningismus.  Cardiovascular: Normal rate, regular rhythm, normal heart sounds and intact distal pulses.  No murmur heard. Pulmonary/Chest: Effort normal and breath sounds normal. No respiratory distress.  Abdominal: Soft. There is no tenderness. There is no rebound and no guarding.  Musculoskeletal: Normal range of motion. She exhibits tenderness. She exhibits no edema.  Left posterior upper arm has approximately 3 x 6 cm area of  erythema and warmth with central induration.  No fluctuance. Intact radial pulse and grip strengths  Neurological: She is alert and oriented to person, place, and time. No cranial nerve deficit. She exhibits normal muscle tone. Coordination normal.  No ataxia on finger to nose bilaterally. No pronator drift. 5/5 strength throughout. CN 2-12 intact.Equal grip strength. Sensation intact.   Skin: Skin is warm.  Psychiatric: She has a normal mood and affect. Her behavior is normal.  Nursing note and vitals reviewed.    ED Treatments / Results  Labs (all labs ordered are listed, but only abnormal results are displayed) Labs Reviewed - No data to display  EKG None  Radiology No results found.  Procedures Procedures (including critical care time)  Medications Ordered in ED Medications  doxycycline (VIBRA-TABS) tablet 100 mg (has no administration in time range)  diphenhydrAMINE (BENADRYL) capsule 25 mg (has no administration in time range)     Initial Impression / Assessment and Plan / ED Course  I have reviewed the triage vital signs and the nursing notes.  Pertinent labs & imaging results that were available during my care of the patient were reviewed by me and considered in my medical decision making (see chart for details).    Patient with likely bug bite to left posterior arm.  No evidence of abscess.  May have early cellulitis but the erythema at this point is likely more allergic in origin.  Will treat with antibiotics,  antihistamines and topical steroids. Followup with PCP in 2 days for a recheck. Return precautions discussed.  Final Clinical Impressions(s) / ED Diagnoses   Final diagnoses:  Insect bite, initial encounter    ED Discharge Orders    None       Henessy Rohrer, Jeannett SeniorStephen, MD 09/24/17 [phone]

## 2018-01-06 ENCOUNTER — Emergency Department (HOSPITAL_BASED_OUTPATIENT_CLINIC_OR_DEPARTMENT_OTHER): Payer: Medicaid Other

## 2018-01-06 ENCOUNTER — Inpatient Hospital Stay (HOSPITAL_BASED_OUTPATIENT_CLINIC_OR_DEPARTMENT_OTHER)
Admission: EM | Admit: 2018-01-06 | Discharge: 2018-01-07 | DRG: 065 | Disposition: A | Discharge status: Home Health | Payer: Medicaid Other | Attending: Internal Medicine | Admitting: Internal Medicine

## 2018-01-06 ENCOUNTER — Inpatient Hospital Stay (HOSPITAL_COMMUNITY): Payer: Medicaid Other

## 2018-01-06 ENCOUNTER — Other Ambulatory Visit: Payer: Self-pay

## 2018-01-06 ENCOUNTER — Encounter (HOSPITAL_BASED_OUTPATIENT_CLINIC_OR_DEPARTMENT_OTHER): Payer: Self-pay | Admitting: Emergency Medicine

## 2018-01-06 DIAGNOSIS — Z6835 Body mass index (BMI) 35.0-35.9, adult: Secondary | ICD-10-CM | POA: Diagnosis not present

## 2018-01-06 DIAGNOSIS — I272 Pulmonary hypertension, unspecified: Secondary | ICD-10-CM | POA: Diagnosis present

## 2018-01-06 DIAGNOSIS — Z880 Allergy status to penicillin: Secondary | ICD-10-CM

## 2018-01-06 DIAGNOSIS — I10 Essential (primary) hypertension: Secondary | ICD-10-CM | POA: Diagnosis present

## 2018-01-06 DIAGNOSIS — I639 Cerebral infarction, unspecified: Secondary | ICD-10-CM | POA: Diagnosis not present

## 2018-01-06 DIAGNOSIS — R202 Paresthesia of skin: Secondary | ICD-10-CM | POA: Diagnosis present

## 2018-01-06 DIAGNOSIS — Z823 Family history of stroke: Secondary | ICD-10-CM | POA: Diagnosis not present

## 2018-01-06 DIAGNOSIS — G8194 Hemiplegia, unspecified affecting left nondominant side: Secondary | ICD-10-CM | POA: Diagnosis present

## 2018-01-06 DIAGNOSIS — Z91018 Allergy to other foods: Secondary | ICD-10-CM

## 2018-01-06 DIAGNOSIS — R29702 NIHSS score 2: Secondary | ICD-10-CM | POA: Diagnosis present

## 2018-01-06 DIAGNOSIS — K219 Gastro-esophageal reflux disease without esophagitis: Secondary | ICD-10-CM | POA: Diagnosis present

## 2018-01-06 DIAGNOSIS — R569 Unspecified convulsions: Secondary | ICD-10-CM

## 2018-01-06 DIAGNOSIS — F17210 Nicotine dependence, cigarettes, uncomplicated: Secondary | ICD-10-CM | POA: Diagnosis present

## 2018-01-06 DIAGNOSIS — I5030 Unspecified diastolic (congestive) heart failure: Secondary | ICD-10-CM | POA: Diagnosis not present

## 2018-01-06 DIAGNOSIS — R413 Other amnesia: Secondary | ICD-10-CM | POA: Diagnosis present

## 2018-01-06 DIAGNOSIS — M549 Dorsalgia, unspecified: Secondary | ICD-10-CM | POA: Diagnosis present

## 2018-01-06 DIAGNOSIS — K069 Disorder of gingiva and edentulous alveolar ridge, unspecified: Secondary | ICD-10-CM | POA: Insufficient documentation

## 2018-01-06 DIAGNOSIS — R29898 Other symptoms and signs involving the musculoskeletal system: Secondary | ICD-10-CM | POA: Diagnosis not present

## 2018-01-06 DIAGNOSIS — G40909 Epilepsy, unspecified, not intractable, without status epilepticus: Secondary | ICD-10-CM

## 2018-01-06 DIAGNOSIS — R4182 Altered mental status, unspecified: Secondary | ICD-10-CM | POA: Diagnosis present

## 2018-01-06 DIAGNOSIS — E669 Obesity, unspecified: Secondary | ICD-10-CM | POA: Diagnosis present

## 2018-01-06 DIAGNOSIS — Z79899 Other long term (current) drug therapy: Secondary | ICD-10-CM

## 2018-01-06 DIAGNOSIS — E785 Hyperlipidemia, unspecified: Secondary | ICD-10-CM | POA: Diagnosis present

## 2018-01-06 DIAGNOSIS — R29810 Facial weakness: Secondary | ICD-10-CM | POA: Diagnosis present

## 2018-01-06 DIAGNOSIS — G4730 Sleep apnea, unspecified: Secondary | ICD-10-CM | POA: Insufficient documentation

## 2018-01-06 DIAGNOSIS — R0602 Shortness of breath: Secondary | ICD-10-CM | POA: Insufficient documentation

## 2018-01-06 DIAGNOSIS — T420X6A Underdosing of hydantoin derivatives, initial encounter: Secondary | ICD-10-CM | POA: Diagnosis present

## 2018-01-06 DIAGNOSIS — R531 Weakness: Secondary | ICD-10-CM | POA: Diagnosis not present

## 2018-01-06 DIAGNOSIS — Z91030 Bee allergy status: Secondary | ICD-10-CM | POA: Diagnosis not present

## 2018-01-06 LAB — URINALYSIS, ROUTINE W REFLEX MICROSCOPIC
Glucose, UA: NEGATIVE mg/dL
HGB URINE DIPSTICK: NEGATIVE
Ketones, ur: NEGATIVE mg/dL
Leukocytes, UA: NEGATIVE
Nitrite: NEGATIVE
PROTEIN: NEGATIVE mg/dL
SPECIFIC GRAVITY, URINE: 1.025 (ref 1.005–1.030)
pH: 6 (ref 5.0–8.0)

## 2018-01-06 LAB — COMPREHENSIVE METABOLIC PANEL
ALT: 18 U/L (ref 0–44)
AST: 21 U/L (ref 15–41)
Albumin: 3.9 g/dL (ref 3.5–5.0)
Alkaline Phosphatase: 86 U/L (ref 38–126)
Anion gap: 9 (ref 5–15)
BILIRUBIN TOTAL: 0.7 mg/dL (ref 0.3–1.2)
BUN: 19 mg/dL (ref 6–20)
CO2: 26 mmol/L (ref 22–32)
CREATININE: 0.71 mg/dL (ref 0.44–1.00)
Calcium: 9.1 mg/dL (ref 8.9–10.3)
Chloride: 100 mmol/L (ref 98–111)
GFR calc Af Amer: >60 mL/min (ref >60)
GFR calc non Af Amer: >60 mL/min (ref >60)
Glucose, Bld: 122 mg/dL — ABNORMAL HIGH (ref 70–99)
POTASSIUM: 3.8 mmol/L (ref 3.5–5.1)
Sodium: 135 mmol/L (ref 135–145)
TOTAL PROTEIN: 7.2 g/dL (ref 6.5–8.1)

## 2018-01-06 LAB — CBC WITH DIFFERENTIAL/PLATELET
BASOS PCT: 1 %
Basophils Absolute: 0 10*3/uL (ref 0.0–0.1)
Eosinophils Absolute: 0.5 10*3/uL (ref 0.0–0.7)
Eosinophils Relative: 6 %
HEMATOCRIT: 40.4 % (ref 36.0–46.0)
Hemoglobin: 13.9 g/dL (ref 12.0–15.0)
LYMPHS PCT: 39 %
Lymphs Abs: 3.1 10*3/uL (ref 0.7–4.0)
MCH: 27.4 pg (ref 26.0–34.0)
MCHC: 34.4 g/dL (ref 30.0–36.0)
MCV: 79.5 fL (ref 78.0–100.0)
MONO ABS: 0.7 10*3/uL (ref 0.1–1.0)
MONOS PCT: 9 %
NEUTROS ABS: 3.7 10*3/uL (ref 1.7–7.7)
Neutrophils Relative %: 45 %
Platelets: 213 10*3/uL (ref 150–400)
RBC: 5.08 MIL/uL (ref 3.87–5.11)
RDW: 14.9 % (ref 11.5–15.5)
WBC: 8 10*3/uL (ref 4.0–10.5)

## 2018-01-06 LAB — ETHANOL: Alcohol, Ethyl (B): <10 mg/dL (ref <10)

## 2018-01-06 LAB — CBG MONITORING, ED: Glucose-Capillary: 130 mg/dL — ABNORMAL HIGH (ref 70–99)

## 2018-01-06 LAB — PHENYTOIN LEVEL, TOTAL: Phenytoin Lvl: 4.4 ug/mL — ABNORMAL LOW (ref 10.0–20.0)

## 2018-01-06 MED ORDER — CLOPIDOGREL BISULFATE 75 MG PO TABS
75.0000 mg | ORAL_TABLET | Freq: Every day | ORAL | Status: DC
  Administered 2018-01-06 – 2018-01-07 (×2): 75 mg via ORAL
  Filled 2018-01-06 (×2): qty 1

## 2018-01-06 MED ORDER — ACETAMINOPHEN 650 MG RE SUPP: 650.0000 mg | RECTAL | Status: DC | PRN

## 2018-01-06 MED ORDER — OXYCODONE-ACETAMINOPHEN 5-325 MG PO TABS
2.0000 | ORAL_TABLET | Freq: Once | ORAL | Status: AC
  Administered 2018-01-06: 2 via ORAL
  Filled 2018-01-06: qty 2

## 2018-01-06 MED ORDER — STROKE: EARLY STAGES OF RECOVERY BOOK
Freq: Once | Status: AC
  Administered 2018-01-06: 17:00:00

## 2018-01-06 MED ORDER — ACETAMINOPHEN 325 MG PO TABS: 650.0000 mg | ORAL_TABLET | ORAL | Status: DC | PRN

## 2018-01-06 MED ORDER — ADULT MULTIVITAMIN W/MINERALS CH
1.0000 | ORAL_TABLET | Freq: Every day | ORAL | Status: DC
  Administered 2018-01-06 – 2018-01-07 (×2): 1 via ORAL
  Filled 2018-01-06 (×2): qty 1

## 2018-01-06 MED ORDER — ENOXAPARIN SODIUM 40 MG/0.4ML ~~LOC~~ SOLN
40.0000 mg | SUBCUTANEOUS | Status: DC
  Administered 2018-01-06: 40 mg via SUBCUTANEOUS
  Filled 2018-01-06: qty 0.4

## 2018-01-06 MED ORDER — ATORVASTATIN CALCIUM 80 MG PO TABS
80.0000 mg | ORAL_TABLET | Freq: Every day | ORAL | Status: DC
  Administered 2018-01-06: 80 mg via ORAL
  Filled 2018-01-06: qty 1

## 2018-01-06 MED ORDER — PHENYTOIN SODIUM 50 MG/ML IJ SOLN
INTRAMUSCULAR | Status: AC
  Filled 2018-01-06: qty 10

## 2018-01-06 MED ORDER — PANTOPRAZOLE SODIUM 40 MG PO TBEC
40.0000 mg | DELAYED_RELEASE_TABLET | Freq: Every day | ORAL | Status: DC
  Administered 2018-01-06 – 2018-01-07 (×2): 40 mg via ORAL
  Filled 2018-01-06: qty 1

## 2018-01-06 MED ORDER — MOMETASONE FURO-FORMOTEROL FUM 100-5 MCG/ACT IN AERO
2.0000 | INHALATION_SPRAY | Freq: Two times a day (BID) | RESPIRATORY_TRACT | Status: DC
  Administered 2018-01-07: 2 via RESPIRATORY_TRACT
  Filled 2018-01-06: qty 8.8

## 2018-01-06 MED ORDER — CHOLECALCIFEROL 10 MCG (400 UNIT) PO TABS
400.0000 [IU] | ORAL_TABLET | Freq: Every day | ORAL | Status: DC
  Administered 2018-01-06 – 2018-01-07 (×2): 400 [IU] via ORAL
  Filled 2018-01-06 (×2): qty 1

## 2018-01-06 MED ORDER — MONTELUKAST SODIUM 10 MG PO TABS
10.0000 mg | ORAL_TABLET | Freq: Every day | ORAL | Status: DC
  Administered 2018-01-06: 10 mg via ORAL
  Filled 2018-01-06: qty 1

## 2018-01-06 MED ORDER — DIPHENHYDRAMINE HCL 25 MG PO CAPS
25.0000 mg | ORAL_CAPSULE | Freq: Four times a day (QID) | ORAL | Status: DC | PRN
  Administered 2018-01-06: 25 mg via ORAL
  Filled 2018-01-06: qty 1

## 2018-01-06 MED ORDER — PHENYTOIN SODIUM EXTENDED 100 MG PO CAPS
400.0000 mg | ORAL_CAPSULE | Freq: Every day | ORAL | Status: DC
  Administered 2018-01-07: 400 mg via ORAL
  Filled 2018-01-06: qty 4

## 2018-01-06 MED ORDER — IBUPROFEN 200 MG PO TABS: 600.0000 mg | ORAL_TABLET | Freq: Four times a day (QID) | ORAL | Status: DC | PRN

## 2018-01-06 MED ORDER — ALBUTEROL SULFATE (2.5 MG/3ML) 0.083% IN NEBU
3.0000 mL | INHALATION_SOLUTION | Freq: Four times a day (QID) | RESPIRATORY_TRACT | Status: DC | PRN
  Administered 2018-01-07: 3 mL via RESPIRATORY_TRACT
  Filled 2018-01-06: qty 3

## 2018-01-06 MED ORDER — SENNOSIDES-DOCUSATE SODIUM 8.6-50 MG PO TABS: 1.0000 | ORAL_TABLET | Freq: Every evening | ORAL | Status: DC | PRN

## 2018-01-06 MED ORDER — ASPIRIN EC 81 MG PO TBEC
81.0000 mg | DELAYED_RELEASE_TABLET | Freq: Every day | ORAL | Status: DC
  Administered 2018-01-06 – 2018-01-07 (×2): 81 mg via ORAL
  Filled 2018-01-06 (×2): qty 1

## 2018-01-06 MED ORDER — SODIUM CHLORIDE 0.9 % IV SOLN
500.0000 mg | Freq: Once | INTRAVENOUS | Status: AC
  Administered 2018-01-06: 500 mg via INTRAVENOUS
  Filled 2018-01-06: qty 10

## 2018-01-06 MED ORDER — GI COCKTAIL ~~LOC~~
30.0000 mL | Freq: Once | ORAL | Status: AC
  Administered 2018-01-06: 30 mL via ORAL
  Filled 2018-01-06: qty 30

## 2018-01-06 MED ORDER — OXYCODONE HCL 5 MG PO TABS
10.0000 mg | ORAL_TABLET | Freq: Four times a day (QID) | ORAL | Status: DC | PRN
  Administered 2018-01-07: 10 mg via ORAL
  Filled 2018-01-06: qty 2

## 2018-01-06 MED ORDER — ACETAMINOPHEN 160 MG/5ML PO SOLN: 650.0000 mg | ORAL | Status: DC | PRN

## 2018-01-06 MED ORDER — SODIUM CHLORIDE 0.9 % IV SOLN
INTRAVENOUS | Status: DC
  Administered 2018-01-06 – 2018-01-07 (×2): via INTRAVENOUS

## 2018-01-06 NOTE — ED Provider Notes (Addendum)
[facility] EMERGENCY DEPARTMENT Provider Note   CSN: [id number] Arrival date & time: 01/06/18  4098     History   Chief Complaint Chief Complaint  Patient presents with  . Numbness    HPI Gloria Hurst is a 57 y.o. female. Level 5 caveat due to confusion. HPI Patient presents with left-sided facial numbness.  Has pain in her left arm and left lower leg also.  Reportedly woke up with this morning was normal when she went to bed about 2 in the morning.  Has a history of seizures but no recent seizure activity.  Reported left elbow has been hurting for a couple weeks.  States the lower face feels now.  Patient's family member states that the face looks different than normal.  Patient was reportedly very sleepy in the car on the way here.  No headache.  Has some confusion. Past Medical History:  Diagnosis Date  . Acid reflux   . Back pain   . Epilepsy (HCC)   . High cholesterol   . HTN (hypertension)   . Obesity     There are no active problems to display for this patient.   Past Surgical History:  Procedure Laterality Date  . APPENDECTOMY    . BREAST REDUCTION SURGERY    . CARPAL TUNNEL RELEASE    . CESAREAN SECTION       OB History   None      Home Medications    Prior to Admission medications   Medication Sig Start Date End Date Taking? Authorizing Provider  dexlansoprazole (DEXILANT) 60 MG capsule Take 60 mg by mouth daily.   Yes [provider]  etodolac (LODINE) 400 MG tablet Take 400 mg by mouth 2 (two) times daily.   Yes [provider]  irbesartan-hydrochlorothiazide (AVALIDE) 300-12.5 MG tablet Take 1 tablet by mouth daily.   Yes [provider]  montelukast (SINGULAIR) 10 MG tablet Take 10 mg by mouth at bedtime.   Yes [provider]  rosuvastatin (CRESTOR) 10 MG tablet Take 1 tablet by mouth daily. 08/19/15  Yes [provider]  albuterol (PROVENTIL HFA) 108 (90 Base) MCG/ACT inhaler Inhale 2 puffs  into the lungs every 6 (six) hours as needed.    [provider]  ibuprofen (ADVIL,MOTRIN) 600 MG tablet Take 1 tablet (600 mg total) by mouth every 6 (six) hours as needed for moderate pain. pain 06/21/17   Fayrene Helper, PA-C  metoprolol succinate (TOPROL-XL) 25 MG 24 hr tablet Take 1 tablet by mouth daily. 12/02/17   [provider]  Oxycodone HCl 10 MG TABS Take 1 tablet by mouth 4 (four) times daily as needed. 12/15/17   [provider]  phenytoin (DILANTIN) 100 MG ER capsule Take 400 mg by mouth daily.    [provider]  SYMBICORT 80-4.5 MCG/ACT inhaler Inhale 2 puffs into the lungs 2 (two) times daily. 11/07/17   [provider]  Vitamin D, Cholecalciferol, 400 units CAPS Take 1 capsule by mouth daily.    [provider]    Family History History reviewed. No pertinent family history.  Social History Social History   Tobacco Use  . Smoking status: Current Every Day Smoker    Packs/day: 0.50    Types: Cigarettes  . Smokeless tobacco: Never Used  Substance Use Topics  . Alcohol use: Yes    Comment: denies  . Drug use: No     Allergies   Penicillins   Review of Systems Review  of Systems  Unable to perform ROS: Mental status change  Constitutional: Negative for appetite change.  Respiratory: Negative for shortness of breath.   Cardiovascular: Negative for chest pain.  Gastrointestinal: Negative for abdominal pain.  Musculoskeletal: Positive for back pain.  Neurological: Positive for weakness and numbness. Negative for headaches.  Psychiatric/Behavioral: Positive for confusion.     Physical Exam Updated Vital Signs BP (!) 178/77 (BP Location: Right Arm)   Pulse (!) 54   Temp 98.5 F (36.9 C) (Oral)   Resp 20   Ht 5\' 2"  (1.575 m)   Wt 88 kg (194 lb)   LMP 03/26/2012   SpO2 100%   BMI 35.48 kg/m   Physical Exam  Constitutional: She appears well-developed.  HENT:  Head: Normocephalic and atraumatic.  Eyes:    Patient is holding her eyes mostly closed.  Appear to open him the full way.  Eye movements however appear to be intact.  Pupils reactive.  Able to raise eyebrows equally.  Neck: Neck supple.  Cardiovascular: Normal rate.  Abdominal: Soft. There is no tenderness.  Musculoskeletal: She exhibits tenderness.  Tenderness to left lower back/SI area.  Tenderness to left elbow.  Neurological: She is alert.  Patient mildly slow to answer.  Appears if there may be some confusion.  However able to give me most of the history.  Paresthesias to left lower face.  May have slight flattening of nasolabial fold on her right side.  However able to raise both eyebrows equally.  Eye movements intact.  Able to puff out both cheeks.  Grip strength equal bilaterally.  However decreased movement at left elbow reportedly secondary to pain.  Tenderness left lower back.  Able to straight leg raise bilaterally and somewhat equally although it is somewhat low on both sides.  Sensation reportedly decreased on right lower extremity compared to left.  Skin: Skin is warm. Capillary refill takes less than 2 seconds.     ED Treatments / Results  Labs (all labs ordered are listed, but only abnormal results are displayed) Labs Reviewed  COMPREHENSIVE METABOLIC PANEL - Abnormal; Notable for the following components:      Result Value   Glucose, Bld 122 (*)    All other components within normal limits  PHENYTOIN LEVEL, TOTAL - Abnormal; Notable for the following components:   Phenytoin Lvl 4.4 (*)    All other components within normal limits  URINALYSIS, ROUTINE W REFLEX MICROSCOPIC - Abnormal; Notable for the following components:   Bilirubin Urine LARGE (*)    All other components within normal limits  CBG MONITORING, ED - Abnormal; Notable for the following components:   Glucose-Capillary 130 (*)    All other components within normal limits  ETHANOL  CBC WITH DIFFERENTIAL/PLATELET    EKG EKG  Interpretation  Date/Time:  Monday January 06 2018 09:22:13 EDT Ventricular Rate:  62 PR Interval:    QRS Duration: 83 QT Interval:  435 QTC Calculation: 442 R Axis:   75 Text Interpretation:  Sinus rhythm Baseline wander in lead(s) V6 T waves now upgoing inferiorly and laterally Confirmed by Benjiman Core [phone]) on 01/06/2018 9:32:50 AM   Radiology Dg Chest 2 View  Result Date: 01/06/2018 CLINICAL DATA:  Left-sided chest pain.  Smoker. EXAM: CHEST - 2 VIEW COMPARISON:  09/02/2017 FINDINGS: The heart size and mediastinal contours are within normal limits. Both lungs are clear. The visualized skeletal structures are unremarkable. IMPRESSION: No active cardiopulmonary disease. Electronically Signed   By: Elige Ko  On: 01/06/2018 10:07   Dg Elbow Complete Left  Result Date: 01/06/2018 CLINICAL DATA:  Left elbow pain. EXAM: LEFT ELBOW - COMPLETE 3+ VIEW COMPARISON:  None. FINDINGS: There is no evidence of fracture, dislocation, or joint effusion. There is no evidence of arthropathy or other focal bone abnormality. Soft tissues are unremarkable. IMPRESSION: Negative. Electronically Signed   By: Elige KoHetal  Patel   On: 01/06/2018 10:07   Ct Head Wo Contrast  Result Date: 01/06/2018 CLINICAL DATA:  Awoke with left-sided facial droop and facial numbness. EXAM: CT HEAD WITHOUT CONTRAST TECHNIQUE: Contiguous axial images were obtained from the base of the skull through the vertex without intravenous contrast. COMPARISON:  Head CT 03/14/2016 FINDINGS: Brain: The brain shows a normal appearance without evidence of malformation, atrophy, old or acute small or large vessel infarction, mass lesion, hemorrhage, hydrocephalus or extra-axial collection. Vascular: No hyperdense vessel. No evidence of atherosclerotic calcification. Skull: Normal.  No traumatic finding.  No focal bone lesion. Sinuses/Orbits: Sinuses are clear. Orbits appear normal. Mastoids are clear. Other: None significant IMPRESSION: Normal head  CT Electronically Signed   By: Paulina FusiMark  Shogry M.D.   On: 01/06/2018 09:58    Procedures Procedures (including critical care time)  Medications Ordered in ED Medications  phenytoin (DILANTIN) 500 mg in sodium chloride 0.9 % 100 mL IVPB (has no administration in time range)     Initial Impression / Assessment and Plan / ED Course  I have reviewed the triage vital signs and the nursing notes.  Pertinent labs & imaging results that were available during my care of the patient were reviewed by me and considered in my medical decision making (see chart for details).     Patient presents with possible left-sided facial droop.  Paresthesias to right lower extremity.  Pain and possible weakness to left side.  Somewhat atypical exam.  Reportedly was normal last night and has some mild confusion at this time.  She is subtherapeutic on her Dilantin.  Head CT reassuring.  Not a TPA candidate due to the category of symptoms and time of onset.  Potentially could have had a seizure last night.  However with some potential neuro deficits will admit to hospitalist.  Patient requests [facility] system.  Final Clinical Impressions(s) / ED Diagnoses   Final diagnoses:  Facial droop  Paresthesia  Seizure disorder [facility](HCC)    ED Discharge Orders    None       Benjiman CorePickering, Shailene Demonbreun, MD 01/06/18 1205    Benjiman CorePickering, Berenise Hunton, MD 01/06/18 [phone]

## 2018-01-06 NOTE — ED Notes (Signed)
ED Provider at bedside. 

## 2018-01-06 NOTE — Progress Notes (Signed)
57 y/o F with h/o seizures on dilantin [facility] with [facility] sided facial droop, confusion. Reports LUE numbness and weakness but on exam has more of a RLE paresthesia. Dilantin a bit low at 4.4. Loaded on dilantin. Pt appears still confused, ?postical. CT head neg.

## 2018-01-06 NOTE — Progress Notes (Signed)
BP = 184/78. MAP, 110. No PRN BP meds. [facility] notified

## 2018-01-06 NOTE — Consult Note (Addendum)
Requesting Physician: Dr. Morrison Old    Chief Complaint: Seizure versus stroke  History obtained from:  Patient   and family  HPI:                                                                                                                                         Gloria Hurst is an 57 y.o. female is a 57 year old female with known hypertension hyperlipidemia who is a smoker and states that she smokes 3 cigarettes a day.  She has been smoking for the past 30 years.  Patient is not on aspirin.  Patient felt fine this morning at 0200 hrs. prior to going to bed.  Upon waking at approximately 0800 hrs. she noted that she had a left facial droop in addition she had difficulty walking secondary to her left leg being weak.  She does have a fianc who is with her and she was brought to [facility] urgency who recommended that she be brought to [facility].  Patient does have a history of seizures but has not had a seizure in many years.  She takes Dilantin and upon hospitalization was found that her Dilantin level was low at 3.9 but corrected 5.0.  Fianc who slept with her did not note that she had any shaking, drooling, nor did she have any incontinence.  Patient also states that this did not feel like a seizure and she usually does not have any continued symptoms.  Date last known well: Date: 01/06/2018 Time last known well: Time: 02:00 tPA Given: No: Out of window NIH stroke scale of 2 Modified Rankin: Rankin Score=0    Past Medical History:  Diagnosis Date  . Acid reflux   . Back pain   . Epilepsy (HCC)   . High cholesterol   . HTN (hypertension)   . Obesity     Past Surgical History:  Procedure Laterality Date  . APPENDECTOMY    . BREAST REDUCTION SURGERY    . CARPAL TUNNEL RELEASE    . CESAREAN SECTION      Family History  Problem Relation Age of Onset  . CAD Mother 40       Died at 22 of MI  . CVA Father 76  . Breast cancer Sister   . Bladder Cancer Maternal Aunt         Died of bladder cancer         Social History:  reports that she has been smoking cigarettes.  She has a 11.10 pack-year smoking history. She has never used smokeless tobacco. She reports that she drinks alcohol. She reports that she does not use drugs.  Allergies:  Allergies  Allergen Reactions  . Bee Venom Anaphylaxis and Shortness Of Breath  . Strawberry Extract Hives  . Penicillins Rash    Has patient had a PCN reaction causing immediate rash, facial/tongue/throat swelling, SOB or lightheadedness with  hypotension: Yes Has patient had a PCN reaction causing severe rash involving mucus membranes or skin necrosis: No Has patient had a PCN reaction that required hospitalization: No Has patient had a PCN reaction occurring within the last 10 years: No If all of the above answers are "NO", then may proceed with Cephalosporin use.    Medications:                                                                                                                           Scheduled: . atorvastatin  80 mg Oral q1800  . cholecalciferol  400 Units Oral Daily  . enoxaparin (LOVENOX) injection  40 mg Subcutaneous Q24H  . mometasone-formoterol  2 puff Inhalation BID  . montelukast  10 mg Oral QHS  . multivitamin with minerals  1 tablet Oral Daily  . pantoprazole  40 mg Oral Daily  . phenytoin      . [START ON 01/07/2018] phenytoin  400 mg Oral Daily    ROS:                                                                                                                                       History obtained from the patient  General ROS: negative for - chills, fatigue, fever, night sweats, weight gain or weight loss Psychological ROS: negative for - , hallucinations, memory difficulties, mood swings or  Ophthalmic ROS: negative for - blurry vision, double vision, eye pain or loss of vision ENT ROS: negative for - epistaxis, nasal discharge, oral lesions, sore throat, tinnitus or  vertigo Respiratory ROS: negative for - cough,  shortness of breath or wheezing Cardiovascular ROS: negative for - chest pain, dyspnea on exertion,  Gastrointestinal ROS: negative for - abdominal pain, diarrhea,  nausea/vomiting or stool incontinence Genito-Urinary ROS: negative for - dysuria, hematuria, incontinence or urinary frequency/urgency Musculoskeletal ROS: Positive for muscular pain especially in her left knee joint Neurological ROS: as noted in HPI   General Examination:  Blood pressure (!) 155/84, pulse (!) 50, temperature 98 F (36.7 C), temperature source Oral, resp. rate 20, height 5\' 2"  (1.575 m), weight 88 kg (194 lb), last menstrual period 03/26/2012, SpO2 98 %.  HEENT-  Normocephalic, no lesions, without obvious abnormality.  Normal external eye and conjunctiva.   Extremities- Warm, dry and intact Musculoskeletal-positive for joint tenderness and muscle tenderness throughout however worse on the left Skin-warm and dry, no hyperpigmentation, vitiligo, or suspicious lesions  Neurological Examination Mental Status: Alert, oriented, thought content appropriate.  Speech dysarthric without evidence of aphasia.  Able to follow 3 step commands without difficulty. Cranial Nerves: II: ; Visual fields grossly normal,  III,IV, VI: ptosis not present, extra-ocular motions intact bilaterally, pupils equal, round, reactive to light and accommodation V,VII: smile symmetric, however she does have a decreased nasolabial fold on the left with decreased sensation on the left aspect of her cheek  VIII: hearing normal bilaterally IX,X: uvula rises symmetrically XI: bilateral shoulder shrug XII: midline tongue extension Motor: Patient showed 5 out of 5 strength on the right with 5/5 strength with left bicep flexion however extension showed a 4/5.  Patient had 4/5 strength on the left leg  however due to pain she had some give way pain it was noted that her knee flexion was much weaker than the extension. Sensory: Decreased on left leg noted neglect Deep Tendon Reflexes: 2+ and symmetric throughout Plantars: Right: downgoing   Left: downgoing Cerebellar: normal finger-to-nose,  and normal heel-to-shin test Gait: Not tested   Lab Results: Basic Metabolic Panel: Recent Labs  Lab 01/06/18 0948  NA 135  K 3.8  CL 100  CO2 26  GLUCOSE 122*  BUN 19  CREATININE 0.71  CALCIUM 9.1    CBC: Recent Labs  Lab 01/06/18 0948  WBC 8.0  NEUTROABS 3.7  HGB 13.9  HCT 40.4  MCV 79.5  PLT 213    Lipid Panel: No results for input(s): CHOL, TRIG, HDL, CHOLHDL, VLDL, LDLCALC in the last 168 hours.  CBG: Recent Labs  Lab 01/06/18 0929  GLUCAP 130*    Imaging: Dg Chest 2 View  Result Date: 01/06/2018 CLINICAL DATA:  Left-sided chest pain.  Smoker. EXAM: CHEST - 2 VIEW COMPARISON:  09/02/2017 FINDINGS: The heart size and mediastinal contours are within normal limits. Both lungs are clear. The visualized skeletal structures are unremarkable. IMPRESSION: No active cardiopulmonary disease. Electronically Signed   By: Elige Ko   On: 01/06/2018 10:07   Dg Elbow Complete Left  Result Date: 01/06/2018 CLINICAL DATA:  Left elbow pain. EXAM: LEFT ELBOW - COMPLETE 3+ VIEW COMPARISON:  None. FINDINGS: There is no evidence of fracture, dislocation, or joint effusion. There is no evidence of arthropathy or other focal bone abnormality. Soft tissues are unremarkable. IMPRESSION: Negative. Electronically Signed   By: Elige Ko   On: 01/06/2018 10:07   Ct Head Wo Contrast  Result Date: 01/06/2018 CLINICAL DATA:  Awoke with left-sided facial droop and facial numbness. EXAM: CT HEAD WITHOUT CONTRAST TECHNIQUE: Contiguous axial images were obtained from the base of the skull through the vertex without intravenous contrast. COMPARISON:  Head CT 03/14/2016 FINDINGS: Brain: The brain shows  a normal appearance without evidence of malformation, atrophy, old or acute small or large vessel infarction, mass lesion, hemorrhage, hydrocephalus or extra-axial collection. Vascular: No hyperdense vessel. No evidence of atherosclerotic calcification. Skull: Normal.  No traumatic finding.  No focal bone lesion. Sinuses/Orbits: Sinuses are clear. Orbits appear normal. Mastoids are clear. Other: None  significant IMPRESSION: Normal head CT Electronically Signed   By: Paulina FusiMark  Shogry M.D.   On: 01/06/2018 09:58    Assessment and plan discussed with with attending physician and they are in agreement.    Felicie MornDavid Smith PA-C [facility] [phone]  01/06/2018, 4:56 PM   Assessment: 57 y.o. 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    Stroke Risk Factors - hyperlipidemia and hypertension  Recommend --HgbA1c, fasting lipid panel --MRI/MRA of the brain without contrast --PT consult, OT consult, Speech consult --Echocardiogram --80 mg of Atorvistatin --Prophylactic therapy-Antiplatelet med: --As NIH stroke scale is less than 5 may consider aspirin plus Plavix for 3 months and definitely needs risk modification including hypertension, cessation of smoking, and decreased cholesterol --Telemetry monitoring --Frequent neuro checks --NPO until passes stroke swallow screen --please page stroke NP  Or  PA  Or MD from 8am -4 pm  as this patient from this time will be  followed by the stroke.   You can look them up on [email or web address]  Password [email or web address]  Ritta SlotMcNeill Dorthey Depace, MD [facility] [phone]  If 7pm- 7am, please page neurology on call as listed in AMION.

## 2018-01-06 NOTE — ED Triage Notes (Addendum)
Reports woke up with left sided facial droop, facial numbness.  Reports left leg and knee pain as well. Reports she was last normal upon going to sleep @ 0200 this morning per her friend.

## 2018-01-06 NOTE — H&P (Signed)
History and Physical    Gloria Hurst ZOX:[id number] DOB: Jan 27, 1961 DOA: 01/06/2018  PCP: [facility]:   Neuro Patient coming from:  Home - lives with son and grandson; [place]:   Chief Complaint: Back pain, unable to walk  HPI: Gloria Hurst is a 57 y.o. female with medical history significant of HTN, HLD, seizure d/o, who woke up this morning around 0800 and noticed her face "felt funny" so she looked in the mirror and her face looked "crooked." Was hard for her to walk due to to BLE weakness, L>>R. LUE was also weak and sore around her L elbow. No recent falls. No chest pain or pressure. No dyspnea. No dysarthria. Family said she was "talking funny" and they couldn't understand what she was saying and pt now realizes she is slurring her words. Symptoms have not improved since then. No worsening. Whole body itching and pt recently had pain med.Has had some mild memory problems but is unable to give me chronicity or whether this is worse. Last seizure was 2 years ago. States she didn't take her dilantin for the past 3 days but can't further elucidate as to why this is. Has been on dilantin since age 81. As a child she had seizures "almost every day" and postictally would not be able to move or walk. She feels similarly today. She had not lost bladder or bowel control.    ED Course: Dilantin level 4.4, loaded on dilantin. CT head was negative, xray L elbow negative  Review of Systems: As per HPI; otherwise review of systems reviewed and negative.   Ambulatory Status:  Ambulates without assistance  Past Medical History:  Diagnosis Date  . Acid reflux   . Back pain   . Epilepsy (HCC)   . High cholesterol   . HTN (hypertension)   . Obesity     Past Surgical History:  Procedure Laterality Date  . APPENDECTOMY    . BREAST REDUCTION SURGERY    . CARPAL TUNNEL RELEASE    . CESAREAN SECTION      Social History   Socioeconomic History  . Marital status: Single   Spouse name: Not on file  . Number of children: Not on file  . Years of education: Not on file  . Highest education level: Not on file  Occupational History  . Not on file  Social Needs  . Financial resource strain: Not on file  . Food insecurity:    Worry: Not on file    Inability: Not on file  . Transportation needs:    Medical: Not on file    Non-medical: Not on file  Tobacco Use  . Smoking status: Current Every Day Smoker    Packs/day: 0.50    Types: Cigarettes  . Smokeless tobacco: Never Used  Substance and Sexual Activity  . Alcohol use: Not Currently    Comment: denies  . Drug use: No  . Sexual activity: Not on file  Lifestyle  . Physical activity:    Days per week: Not on file    Minutes per session: Not on file  . Stress: Not on file  Relationships  . Social connections:    Talks on phone: Not on file    Gets together: Not on file    Attends religious service: Not on file    Active member of club or organization: Not on file    Attends meetings of clubs or organizations: Not on file    Relationship status: Not on  file  . Intimate partner violence:    Fear of current or ex partner: Not on file    Emotionally abused: Not on file    Physically abused: Not on file    Forced sexual activity: Not on file  Other Topics Concern  . Not on file  Social History Narrative  . Not on file    Allergies  Allergen Reactions  . Bee Venom Anaphylaxis and Shortness Of Breath  . Strawberry Extract Hives  . Penicillins Rash    Has patient had a PCN reaction causing immediate rash, facial/tongue/throat swelling, SOB or lightheadedness with hypotension: Yes Has patient had a PCN reaction causing severe rash involving mucus membranes or skin necrosis: No Has patient had a PCN reaction that required hospitalization: No Has patient had a PCN reaction occurring within the last 10 years: No If all of the above answers are "NO", then may proceed with Cephalosporin use.     History reviewed. No pertinent family history.  Prior to Admission medications   Medication Sig Start Date End Date Taking? Authorizing Provider  albuterol (PROVENTIL HFA) 108 (90 Base) MCG/ACT inhaler Inhale 2 puffs into the lungs every 6 (six) hours as needed for wheezing or shortness of breath.    Yes [provider]  budesonide-formoterol (SYMBICORT) 80-4.5 MCG/ACT inhaler Inhale 2 puffs into the lungs 2 (two) times daily.   Yes [provider]  cholecalciferol (VITAMIN D) 400 units TABS tablet Take 400 Units by mouth daily.   Yes [provider]  dexlansoprazole (DEXILANT) 60 MG capsule Take 60 mg by mouth daily.   Yes [provider]  diphenhydrAMINE-Zinc Acetate (BENADRYL EX) Apply 1 application topically daily as needed (itching).   Yes [provider]  etodolac (LODINE) 400 MG tablet Take 400 mg by mouth 2 (two) times daily as needed (joint pain/stiffness).    Yes [provider]  ibuprofen (ADVIL,MOTRIN) 600 MG tablet Take 1 tablet (600 mg total) by mouth every 6 (six) hours as needed for moderate pain. pain 06/21/17  Yes Fayrene Helper, PA-C  irbesartan-hydrochlorothiazide (AVALIDE) 300-12.5 MG tablet Take 1 tablet by mouth daily.   Yes [provider]  metoprolol succinate (TOPROL-XL) 25 MG 24 hr tablet Take 25 mg by mouth daily.  12/02/17  Yes [provider]  montelukast (SINGULAIR) 10 MG tablet Take 10 mg by mouth at bedtime.   Yes [provider]  Multiple Vitamin (MULTIVITAMIN WITH MINERALS) TABS tablet Take 1 tablet by mouth daily. Centrum   Yes [provider]  Oxycodone HCl 10 MG TABS Take 10 mg by mouth 4 (four) times daily as needed (pain).  12/15/17  Yes [provider]  phenytoin (DILANTIN) 100 MG ER capsule Take 400 mg by mouth daily.   Yes [provider]  PRESCRIPTION MEDICATION Inhale into the lungs at bedtime. CPAP   Yes [provider]  rosuvastatin  (CRESTOR) 10 MG tablet Take 10 mg by mouth daily.  08/19/15  Yes [provider]    Physical Exam: Vitals:   01/06/18 1257 01/06/18 1300 01/06/18 1314 01/06/18 1445  BP: (!) 161/78 (!) 161/78  (!) 155/84  Pulse: (!) 52 (!) 51  (!) 50  Resp: 20 18  20   Temp:   98.5 F (36.9 C) 98.4 F (36.9 C)  TempSrc:    Oral  SpO2: 99% 98%  98%  Weight:      Height:         . General:  Appears calm and  comfortable and is NAD . Eyes:  PERRL, EOMI, normal lids, iris . ENT:  grossly normal hearing, lips & tongue, mmm; appropriate dentition . Neck:  no LAD, masses or thyromegaly; no carotid bruits . Cardiovascular: nl S1, loud P2, no M. No LE edema.  Marland Kitchen Respiratory:   CTA bilaterally with no wheezes/rales/rhonchi.  Normal respiratory effort. . Abdomen:  soft, NT, ND, NABS . Skin:  no rash or induration seen on limited exam . Musculoskeletal:  grossly normal tone BUE/BLE, good ROM, no bony abnormality . Lower extremity:  No LE edema.  Limited foot exam with no ulcerations.  2+ distal pulses. Marland Kitchen Psychiatric:  grossly normal mood and affect . Neurologic:  CN 2-12 grossly intact, moves all extremities in coordinated fashion, sensation intact. Motor exam 5/5 except for LLE 4/5. Thinks it is January 04, 2018, Monday (it is July 8). [facility]s she is at [facility]. No dysmetria.   Radiological Exams on Admission: Dg Chest 2 View  Result Date: 01/06/2018 CLINICAL DATA:  Left-sided chest pain.  Smoker. EXAM: CHEST - 2 VIEW COMPARISON:  09/02/2017 FINDINGS: The heart size and mediastinal contours are within normal limits. Both lungs are clear. The visualized skeletal structures are unremarkable. IMPRESSION: No active cardiopulmonary disease. Electronically Signed   By: Elige Ko   On: 01/06/2018 10:07   Dg Elbow Complete Left  Result Date: 01/06/2018 CLINICAL DATA:  Left elbow pain. EXAM: LEFT ELBOW - COMPLETE 3+ VIEW COMPARISON:  None. FINDINGS: There is no evidence of fracture, dislocation, or joint effusion.  There is no evidence of arthropathy or other focal bone abnormality. Soft tissues are unremarkable. IMPRESSION: Negative. Electronically Signed   By: Elige Ko   On: 01/06/2018 10:07   Ct Head Wo Contrast  Result Date: 01/06/2018 CLINICAL DATA:  Awoke with left-sided facial droop and facial numbness. EXAM: CT HEAD WITHOUT CONTRAST TECHNIQUE: Contiguous axial images were obtained from the base of the skull through the vertex without intravenous contrast. COMPARISON:  Head CT 03/14/2016 FINDINGS: Brain: The brain shows a normal appearance without evidence of malformation, atrophy, old or acute small or large vessel infarction, mass lesion, hemorrhage, hydrocephalus or extra-axial collection. Vascular: No hyperdense vessel. No evidence of atherosclerotic calcification. Skull: Normal.  No traumatic finding.  No focal bone lesion. Sinuses/Orbits: Sinuses are clear. Orbits appear normal. Mastoids are clear. Other: None significant IMPRESSION: Normal head CT Electronically Signed   By: Paulina Fusi M.D.   On: 01/06/2018 09:58    EKG: Independently reviewed.  NSR with rate 62; Sinus rhythm Baseline wander in lead(s) V6 T waves now upgoing inferiorly and laterally   Labs on Admission: I have personally reviewed the available labs and imaging studies at the time of the admission.  Pertinent labs:  NA 135 K 3.8 Cl 100 CO2 26 BUN 19 Creat 0.71 LFTs OK Alb 3.9 Ca 9.1 CBC normal Dilantin level 4.4 Gluc 122 EtOH <10   Assessment/Plan Principal Problem:   Left leg weakness Active Problems:   Altered mental state   Essential hypertension   GERD (gastroesophageal reflux disease)   Hyperlipidemia   Seizures (HCC)   L sided weakness: concern for R sided CVA vs seizure/ postictal state.  -appreciate neuro consult -CVA order set -atorvastatin 80 mg daily -ASA plus plavix for 3 mo -HbA1C, lipid panel -f/u MRI/MRA brain  -f/u TTE -telemetry -freq neuro checks -NPO until passes stroke  swallow screen -risk factor modification: HTN, HLD, smoking cessation  HTN -hold antihypertensives to allow permissive HTN for now  Hyperlipidemia -hold crestor, start lipitor 80 mg daily   Seizure d/o -cont dilantin   DVT prophylaxis:  Lovenox Code Status:  Full - confirmed with patient/family Family Communication:   Disposition Plan:  TBD once clinically improved Consults called: Neuro  Admission status: Admit - It is my clinical opinion that admission to INPATIENT is reasonable and necessary because of the expectation that this patient will require hospital care that crosses at least 2 midnights to treat this condition based on the medical complexity of the problems presented.  Given the aforementioned information, the predictability of an adverse outcome is felt to be significant.     Elyse HsuSally M Kapena Hamme MD [facility]  If note is complete, please contact covering daytime or nighttime physician. [email or web address] Password [email or web address]  01/06/2018, 4:09 PM

## 2018-01-07 ENCOUNTER — Inpatient Hospital Stay (HOSPITAL_COMMUNITY): Payer: Medicaid Other

## 2018-01-07 DIAGNOSIS — R531 Weakness: Secondary | ICD-10-CM

## 2018-01-07 DIAGNOSIS — I639 Cerebral infarction, unspecified: Secondary | ICD-10-CM

## 2018-01-07 DIAGNOSIS — I5030 Unspecified diastolic (congestive) heart failure: Secondary | ICD-10-CM

## 2018-01-07 DIAGNOSIS — I10 Essential (primary) hypertension: Secondary | ICD-10-CM

## 2018-01-07 DIAGNOSIS — R29898 Other symptoms and signs involving the musculoskeletal system: Secondary | ICD-10-CM

## 2018-01-07 LAB — HIV ANTIBODY (ROUTINE TESTING W REFLEX): HIV Screen 4th Generation wRfx: NONREACTIVE

## 2018-01-07 LAB — HEMOGLOBIN A1C
Hgb A1c MFr Bld: 6.2 % — ABNORMAL HIGH (ref 4.8–5.6)
Mean Plasma Glucose: 131.24 mg/dL

## 2018-01-07 LAB — LIPID PANEL
Cholesterol: 172 mg/dL (ref 0–200)
HDL: 46 mg/dL (ref >40)
LDL Cholesterol: 100 mg/dL — ABNORMAL HIGH (ref 0–99)
Total CHOL/HDL Ratio: 3.7 RATIO
Triglycerides: 132 mg/dL (ref <150)
VLDL: 26 mg/dL (ref 0–40)

## 2018-01-07 LAB — ECHOCARDIOGRAM COMPLETE
Height: 62 in
Weight: 3104 oz

## 2018-01-07 MED ORDER — ROSUVASTATIN CALCIUM 20 MG PO TABS: 20.0000 mg | ORAL_TABLET | Freq: Every day | ORAL | 0 refills | Status: AC

## 2018-01-07 MED ORDER — ASPIRIN 81 MG PO TBEC: 81.0000 mg | DELAYED_RELEASE_TABLET | Freq: Every day | ORAL | 0 refills | Status: AC

## 2018-01-07 NOTE — Evaluation (Signed)
Speech Language Pathology Evaluation Patient Details Name: Gloria Hurst MRN: [id number] DOB: October 20, 1960 Today's Date: 01/07/2018 Time: 6578-4696 SLP Time Calculation (min) (ACUTE ONLY): 13 min  Problem List:  Patient Active Problem List   Diagnosis Date Noted  . Altered mental state 01/06/2018  . GERD (gastroesophageal reflux disease) 01/06/2018  . Gum disease 01/06/2018  . Shortness of breath 01/06/2018  . Sleep apnea 01/06/2018  . Left leg weakness 01/06/2018  . Epigastric pain 08/31/2015  . Cholecystitis 08/30/2015  . Duodenitis 08/30/2015  . Essential hypertension 08/30/2015  . Hematochezia 08/30/2015  . Hyperlipidemia 08/30/2015  . Seizures (HCC) 08/30/2015  . Bilateral carpal tunnel syndrome 03/24/2014  . Low back pain 02/04/2014  . Lumbar radiculopathy 02/04/2014  . Lumbar spondylosis 02/04/2014  . Right knee pain 02/04/2014   Past Medical History:  Past Medical History:  Diagnosis Date  . Acid reflux   . Back pain   . Epilepsy (HCC)   . High cholesterol   . HTN (hypertension)   . Obesity    Past Surgical History:  Past Surgical History:  Procedure Laterality Date  . APPENDECTOMY    . BREAST REDUCTION SURGERY    . CARPAL TUNNEL RELEASE    . CESAREAN SECTION     HPI:  57 year old female with hypertension hyperlipidemia who is a smoker, presenting with reports of left facial droop in addition to difficulty walking secondary to her left leg being weak.   Patient does have a history of seizures but has not had a seizure in many years.     Assessment / Plan / Recommendation Clinical Impression   Pt presents with limited evaluation.  Pt was drowsy at time of evaluation and reports this to be secondary to medications; however, pt is unable to specifically recall which medications she has taken that could have attributed to her feeling altered.  Pt was given oxycodone although she reports that she takes that med at home without side effects.  Question Benadryl which  pt reports was given to counteract allergic reaction to some med at outside hospital.  Overall, pt appears to be a poor historian.  Pt's speech is slurred but her oral motor weakness is minimal so question whether medication/sedation are causing this as well.  Pt is also disinhibited and inattentive but it remains unclear whether this is baseline personality versus secondary to meds, versus primary cognitive impairment.  More diagnostic treatment is warranted at this time as pt becomes more alert and attentive.      SLP Assessment  SLP Recommendation/Assessment: Patient needs continued Speech Lanaguage Pathology Services SLP Visit Diagnosis: Cognitive communication deficit (R41.841);Dysarthria and anarthria (R47.1)    Follow Up Recommendations  Other (comment)(To be determined)    Frequency and Duration min 1 x/week         SLP Evaluation Cognition  Overall Cognitive Status: Difficult to assess Arousal/Alertness: Suspect due to medications Orientation Level: Oriented X4 Attention: Sustained Sustained Attention: Impaired Sustained Attention Impairment: Verbal basic;Functional basic Memory: Impaired Memory Impairment: Decreased recall of new information Awareness: Impaired Awareness Impairment: Emergent impairment Behaviors: Restless Safety/Judgment: Impaired       Comprehension  Auditory Comprehension Overall Auditory Comprehension: Appears within functional limits for tasks assessed    Expression Expression Primary Mode of Expression: Verbal Verbal Expression Overall Verbal Expression: Appears within functional limits for tasks assessed Written Expression Dominant Hand: Left   Oral / Motor  Oral Motor/Sensory Function Overall Oral Motor/Sensory Function: Mild impairment Facial ROM: Within Functional Limits Facial Symmetry: Abnormal symmetry left  Facial Strength: Within Functional Limits Lingual ROM: Within Functional Limits Lingual Symmetry: Within Functional  Limits Lingual Strength: Within Functional Limits Motor Speech Overall Motor Speech: Impaired Respiration: Within functional limits Phonation: Normal Resonance: Within functional limits Articulation: Impaired Level of Impairment: Conversation Intelligibility: Intelligibility reduced Conversation: 75-100% accurate Motor Planning: Witnin functional limits   GO                    [name], Melanee SpryNicole L 01/07/2018, 11:35 AM

## 2018-01-07 NOTE — Progress Notes (Signed)
Pt discharged to home in stable condition, all discharge instructions and Rx's X 2 reviewed with and given to pt.  All belongings taken by pt and sister.  Pt transported via wheel chair with this RN at chairside.  [id number]

## 2018-01-07 NOTE — Progress Notes (Signed)
Inpatient Rehabilitation  Per PT and OT request, patient was screened by [name] for appropriateness for an Inpatient Acute Rehab consult.  Per chart review note that patient is high level and being discharged home as a result, will defer.  If patient remains in house and warranted please order consult.  Call if questions.   Charlane FerrettiMelissa Thaxton Pelley, M.A., CCC/SLP Admission Coordinator  [facility]  Cell [phone]

## 2018-01-07 NOTE — Progress Notes (Signed)
*  Preliminary Results* Carotid artery duplex has been completed. Bilateral internal carotid arteries are 1-39%. Vertebral arteries are patent with antegrade flow.  01/07/2018 9:30 AM  Gloria Hurst

## 2018-01-07 NOTE — Evaluation (Addendum)
Physical Therapy Evaluation Patient Details Name: Gloria Hurst MRN: [id number] DOB: 30-Jun-1961 Today's Date: 01/07/2018   History of Present Illness  57 y.o. 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  Clinical Impression  Orders received for PT evaluation. Patient demonstrates deficits in functional mobility as indicated below. Will benefit from continued skilled PT to address deficits and maximize function. Will see as indicated and progress as tolerated.  Patient requiringassist for mobility at this time with noted deviations evolving with fatigue. Given patient's prior level of function, her current mobility status and her ability to regain function feel patient may benefit from continued rehabilitation, will recommend CIR consult at this time and continue to assess mobility gains each day as patient states that she does not feel she could manage safely at home in current condition.    Follow Up Recommendations CIR    Equipment Recommendations  (TBD)    Recommendations for Other Services Rehab consult     Precautions / Restrictions Precautions Precautions: Fall Restrictions Weight Bearing Restrictions: No      Mobility  Bed Mobility Overal bed mobility: Needs Assistance Bed Mobility: Rolling;Supine to Sit Rolling: Min guard   Supine to sit: Min assist     General bed mobility comments: min assist to power trunk to upright, limited ability to push through LUE due to pain.  Transfers Overall transfer level: Needs assistance Equipment used: 1 person hand held assist Transfers: Sit to/from Stand Sit to Stand: Min assist         General transfer comment: min assist for stability upon coming to standing, legs resting posteriorly against bed  Ambulation/Gait Ambulation/Gait assistance: Min assist Gait Distance (Feet): 90 Feet Assistive device: 1 person hand held  assist;IV Pole Gait Pattern/deviations: Step-through pattern;Decreased stride length;Shuffle;Trunk flexed Gait velocity: decreased   General Gait Details: patient initially min assist with noted LLE lag during ambulation, increased time and effort to maintain midline and pacing. As patient began to fatigue increased physical assist required for multiple LOB.  Stairs            Wheelchair Mobility    Modified Rankin (Stroke Patients Only) Modified Rankin (Stroke Patients Only) Pre-Morbid Rankin Score: No symptoms Modified Rankin: Moderately severe disability     Balance Overall balance assessment: Needs assistance Sitting-balance support: Feet supported Sitting balance-Leahy Scale: Fair     Standing balance support: Single extremity supported;During functional activity Standing balance-Leahy Scale: Fair Standing balance comment: maintains static standing with close minguard for safety                              Pertinent Vitals/Pain Pain Assessment: 0-10 Pain Score: 4  Faces Pain Scale: Hurts even more Pain Location: LUE, LLE, Back Pain Descriptors / Indicators: Aching;Discomfort Pain Intervention(s): Monitored during session    Home Living Family/patient expects to be discharged to:: Private residence Living Arrangements: Children Available Help at Discharge: Family Type of Home: House Home Access: Stairs to enter Entrance Stairs-Rails: None Secretary/administratorntrance Stairs-Number of Steps: 6 Home Layout: One level Home Equipment: None      Prior Function Level of Independence: Independent         Comments: gardener     Hand Dominance   Dominant Hand: Left    Extremity/Trunk Assessment   Upper Extremity Assessment Upper Extremity Assessment: LUE deficits/detail LUE Deficits / Details: limited AROM  due to pain, generalized weakness and decreased fine motor, good grip strength LUE: Unable to fully assess due to pain    Lower Extremity  Assessment Lower Extremity Assessment: LLE deficits/detail LLE Sensation: decreased light touch LLE Coordination: decreased fine motor       Communication      Cognition Arousal/Alertness: Awake/alert Behavior During Therapy: WFL for tasks assessed/performed Overall Cognitive Status: Difficult to assess                                        General Comments      Exercises     Assessment/Plan    PT Assessment Patient needs continued PT services  PT Problem List Decreased strength;Decreased activity tolerance;Decreased balance;Decreased mobility;Decreased safety awareness;Pain       PT Treatment Interventions DME instruction;Gait training;Stair training;Functional mobility training;Therapeutic activities;Therapeutic exercise;Balance training;Neuromuscular re-education;Patient/family education    PT Goals (Current goals can be found in the Care Plan section)  Acute Rehab PT Goals Patient Stated Goal: regain independence, get back to her garden  PT Goal Formulation: With patient Time For Goal Achievement: 01/21/18 Potential to Achieve Goals: Good    Frequency Min 3X/week   Barriers to discharge        Co-evaluation PT/OT/SLP Co-Evaluation/Treatment: Yes Reason for Co-Treatment: Complexity of the patient's impairments (multi-system involvement) PT goals addressed during session: Mobility/safety with mobility OT goals addressed during session: ADL's and self-care       AM-PAC PT "6 Clicks" Daily Activity  Outcome Measure Difficulty turning over in bed (including adjusting bedclothes, sheets and blankets)?: A Little Difficulty moving from lying on back to sitting on the side of the bed? : Unable Difficulty sitting down on and standing up from a chair with arms (e.g., wheelchair, bedside commode, etc,.)?: Unable Help needed moving to and from a bed to chair (including a wheelchair)?: A Little Help needed walking in hospital room?: A Lot Help needed  climbing 3-5 steps with a railing? : A Lot 6 Click Score: 12    End of Session Equipment Utilized During Treatment: Gait belt Activity Tolerance: Patient tolerated treatment well Patient left: in chair;with call bell/phone within reach;with chair alarm set;with family/visitor present Nurse Communication: Mobility status PT Visit Diagnosis: Difficulty in walking, not elsewhere classified (R26.2);Other symptoms and signs involving the nervous system (R29.898)    Time: 1610-9604 PT Time Calculation (min) (ACUTE ONLY): 28 min   Charges:   PT Evaluation $PT Eval Moderate Complexity: 1 Mod     PT G Codes:        Charlotte Crumb, PT DPT  Board Certified Neurologic Specialist [phone]   Fabio Asa 01/07/2018, 2:13 PM

## 2018-01-07 NOTE — Care Management Note (Signed)
Case Management Note  Patient Details  Name: Gloria Hurst MRN: [id number] Date of Birth: May 24, 1961  Subjective/Objective:          Pt in with lt leg weakness. She is from home with family.          Action/Plan: Pt [facility]. CM [facility] and [facility] on. Eber JonesCarolyn with [facility] notified and accepted the referral.  Pt with orders for 3in 1. [facility] and will deliver to the room. Pt has 24 hour supervision at home and transportation to home.   Expected Discharge Date:  01/07/18               Expected Discharge Plan:  Home w Home Health Services  In-House Referral:     Discharge planning Services  CM Consult  Post Acute Care Choice:  Home Health, Durable Medical Equipment Choice offered to:  Patient, Adult Children  DME Arranged:  3-N-1 DME Agency:  [facility]  HH Arranged:  RN, PT, OT [facility]  [facility]  Status of Service:  Completed, signed off  If discussed at Long Length of Stay Meetings, dates discussed:    Additional Comments:  Kermit BaloKelli F Hanif Radin, RN 01/07/2018, 3:30 PM

## 2018-01-07 NOTE — Evaluation (Addendum)
Occupational Therapy Evaluation Patient Details Name: Gloria Hurst MRN: [id number] DOB: 12-25-1960 Today's Date: 01/07/2018    History of Present Illness 57 y.o. 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   Clinical Impression   This 57 y/o female presents with the above. At baseline pt is independent with ADLs, iADLs and functional mobility. Pt presenting with LUE pain and functional limitations, decreased dynamic balance and activity tolerance. Pt demonstrating functional mobility this session with overall MinA (HHA); currently requires minA for standing grooming and UB ADLs, MinA for LB ADL. Pt fatigued with room and hallway level activity this session, but very pleasant throughout and motivated to work and progress with therapy to progress towards PLOF. Pt will benefit from continued acute OT services and recommend follow up CIR level therapy services at time of discharge to maximize her overall safety and independence with ADLs and mobility prior to return home.     Follow Up Recommendations  CIR;Supervision/Assistance - 24 hour    Equipment Recommendations  3 in 1 bedside commode           Precautions / Restrictions Precautions Precautions: Fall Restrictions Weight Bearing Restrictions: No      Mobility Bed Mobility Overal bed mobility: Needs Assistance Bed Mobility: Rolling;Supine to Sit Rolling: Min guard   Supine to sit: Min assist     General bed mobility comments: min assist to power trunk to upright, limited ability to push through LUE due to pain.  Transfers Overall transfer level: Needs assistance Equipment used: 1 person hand held assist Transfers: Sit to/from Stand Sit to Stand: Min assist         General transfer comment: min assist for stability upon coming to standing, legs resting posteriorly against bed    Balance Overall balance assessment: Needs  assistance Sitting-balance support: Feet supported Sitting balance-Leahy Scale: Fair     Standing balance support: Single extremity supported;During functional activity Standing balance-Leahy Scale: Fair Standing balance comment: maintains static standing with close minguard for safety                            ADL either performed or assessed with clinical judgement   ADL Overall ADL's : Needs assistance/impaired Eating/Feeding: NPO   Grooming: Min guard;Minimal assistance;Wash/dry face;Standing Grooming Details (indicate cue type and reason): minA progressed to minguard for standing balance  Upper Body Bathing: Min guard;Sitting   Lower Body Bathing: Minimal assistance;Sit to/from stand   Upper Body Dressing : Sitting;Minimal assistance   Lower Body Dressing: Minimal assistance;Sit to/from stand Lower Body Dressing Details (indicate cue type and reason): increased time/effort to don socks seated EOB; pt utilizing one handed technique due to LUE pain  Toilet Transfer: Minimal assistance;Ambulation;Regular Teacher, adult education Details (indicate cue type and reason): simulated in transfer to recliner  Toileting- Clothing Manipulation and Hygiene: Minimal assistance;Sit to/from stand       Functional mobility during ADLs: Minimal assistance       Vision Baseline Vision/History: Wears glasses Wears Glasses: Reading only Patient Visual Report: Other (comment)(light sensitivity ) Vision Assessment?: Yes;Vision impaired- to be further tested in functional context Eye Alignment: Within Functional Limits Ocular Range of Motion: Within Functional Limits Alignment/Gaze Preference: Within Defined Limits Tracking/Visual Pursuits: Unable to hold eye position out of midline;Decreased smoothness of horizontal tracking Saccades: Additional eye shifts occurred during testing Visual Fields: No apparent deficits  Perception     Praxis      Pertinent Vitals/Pain Pain  Assessment: Faces Faces Pain Scale: Hurts even more Pain Location: LUE and headache Pain Descriptors / Indicators: Aching;Discomfort Pain Intervention(s): Monitored during session     Hand Dominance Left   Extremity/Trunk Assessment Upper Extremity Assessment Upper Extremity Assessment: LUE deficits/detail LUE Deficits / Details: limited AROM due to pain, generalized weakness and decreased fine motor, good grip strength LUE: Unable to fully assess due to pain   Lower Extremity Assessment Lower Extremity Assessment: LLE deficits/detail LLE Sensation: decreased light touch LLE Coordination: decreased fine motor       Communication     Cognition Arousal/Alertness: Awake/alert Behavior During Therapy: WFL for tasks assessed/performed Overall Cognitive Status: Within Functional Limits for tasks assessed                                                      Home Living Family/patient expects to be discharged to:: Private residence Living Arrangements: Children   Type of Home: House Home Access: Stairs to enter Secretary/administratorntrance Stairs-Number of Steps: 6 Entrance Stairs-Rails: None Home Layout: One level     Bathroom Shower/Tub: Tub only   FirefighterBathroom Toilet: Standard     Home Equipment: None          Prior Functioning/Environment Level of Independence: Independent        Comments: gardener        OT Problem List: Decreased strength;Impaired balance (sitting and/or standing);Pain;Impaired UE functional use;Decreased activity tolerance;Impaired vision/perception;Decreased range of motion      OT Treatment/Interventions: Self-care/ADL training;Therapeutic activities;DME and/or AE instruction;Balance training;Therapeutic exercise;Patient/family education    OT Goals(Current goals can be found in the care plan section) Acute Rehab OT Goals Patient Stated Goal: regain independence, get back to her garden  OT Goal Formulation: With patient Time For  Goal Achievement: 01/21/18 Potential to Achieve Goals: Good  OT Frequency: Min 2X/week   Barriers to D/C:            Co-evaluation PT/OT/SLP Co-Evaluation/Treatment: Yes Reason for Co-Treatment: Complexity of the patient's impairments (multi-system involvement) PT goals addressed during session: Mobility/safety with mobility OT goals addressed during session: ADL's and self-care      AM-PAC PT "6 Clicks" Daily Activity     Outcome Measure Help from another person eating meals?: Total(NPO) Help from another person taking care of personal grooming?: A Little Help from another person toileting, which includes using toliet, bedpan, or urinal?: A Little Help from another person bathing (including washing, rinsing, drying)?: A Little Help from another person to put on and taking off regular upper body clothing?: A Little Help from another person to put on and taking off regular lower body clothing?: A Little 6 Click Score: 16   End of Session Equipment Utilized During Treatment: Gait belt Nurse Communication: Mobility status  Activity Tolerance: Patient tolerated treatment well Patient left: in chair;with call bell/phone within reach;with chair alarm set;with family/visitor present  OT Visit Diagnosis: Muscle weakness (generalized) (M62.81);Other symptoms and signs involving the nervous system (R29.898)                Time: [id number] OT Time Calculation (min): 29 min Charges:  OT General Charges $OT Visit: 1 Visit OT Evaluation $OT Eval Moderate Complexity: 1 Mod G-Codes:     Marcy SirenBreanna Che Rachal, OT Pager [phone]  01/07/2018   Orlando Penner 01/07/2018, 11:25 AM

## 2018-01-07 NOTE — Progress Notes (Addendum)
STROKE TEAM PROGRESS NOTE   INTERVAL HISTORY Her sister  is at the bedside with questions, that were addressed. Patient weak on left arm and  Leg, but no left facial weakness.  Vitals:   01/07/18 0400 01/07/18 0756 01/07/18 0804 01/07/18 0805  BP: 140/83  (!) 157/89   Pulse:  (!) 54 (!) 55   Resp:  18 16   Temp: (!) 97.5 F (36.4 C)  98.2 F (36.8 C)   TempSrc: Axillary  Oral   SpO2: 100% 97% 100% 98%  Weight:      Height:        CBC:  Recent Labs  Lab 01/06/18 0948  WBC 8.0  NEUTROABS 3.7  HGB 13.9  HCT 40.4  MCV 79.5  PLT 213    Basic Metabolic Panel:  Recent Labs  Lab 01/06/18 0948  NA 135  K 3.8  CL 100  CO2 26  GLUCOSE 122*  BUN 19  CREATININE 0.71  CALCIUM 9.1   Lipid Panel:     Component Value Date/Time   CHOL 172 01/07/2018 0339   TRIG 132 01/07/2018 0339   HDL 46 01/07/2018 0339   CHOLHDL 3.7 01/07/2018 0339   VLDL 26 01/07/2018 0339   LDLCALC 100 (H) 01/07/2018 0339   HgbA1c:  Lab Results  Component Value Date   HGBA1C 6.2 (H) 01/07/2018   Urine Drug Screen: No results found for: LABOPIA, COCAINSCRNUR, LABBENZ, AMPHETMU, THCU, LABBARB  Alcohol Level     Component Value Date/Time   ETH <10 01/06/2018 0948    IMAGING Dg Chest 2 View  Result Date: 01/06/2018 CLINICAL DATA:  Left-sided chest pain.  Smoker. EXAM: CHEST - 2 VIEW COMPARISON:  09/02/2017 FINDINGS: The heart size and mediastinal contours are within normal limits. Both lungs are clear. The visualized skeletal structures are unremarkable. IMPRESSION: No active cardiopulmonary disease. Electronically Signed   By: Elige Ko   On: 01/06/2018 10:07   Dg Elbow Complete Left  Result Date: 01/06/2018 CLINICAL DATA:  Left elbow pain. EXAM: LEFT ELBOW - COMPLETE 3+ VIEW COMPARISON:  None. FINDINGS: There is no evidence of fracture, dislocation, or joint effusion. There is no evidence of arthropathy or other focal bone abnormality. Soft tissues are unremarkable. IMPRESSION: Negative.  Electronically Signed   By: Elige Ko   On: 01/06/2018 10:07   Ct Head Wo Contrast  Result Date: 01/06/2018 CLINICAL DATA:  Awoke with left-sided facial droop and facial numbness. EXAM: CT HEAD WITHOUT CONTRAST TECHNIQUE: Contiguous axial images were obtained from the base of the skull through the vertex without intravenous contrast. COMPARISON:  Head CT 03/14/2016 FINDINGS: Brain: The brain shows a normal appearance without evidence of malformation, atrophy, old or acute small or large vessel infarction, mass lesion, hemorrhage, hydrocephalus or extra-axial collection. Vascular: No hyperdense vessel. No evidence of atherosclerotic calcification. Skull: Normal.  No traumatic finding.  No focal bone lesion. Sinuses/Orbits: Sinuses are clear. Orbits appear normal. Mastoids are clear. Other: None significant IMPRESSION: Normal head CT Electronically Signed   By: Paulina Fusi M.D.   On: 01/06/2018 09:58   Mr Shirlee Latch WU Contrast  Result Date: 01/06/2018 CLINICAL DATA:  57 y/o F; history of seizures on Dilantin presenting with left-sided facial droop and confusion. EXAM: MRI HEAD WITHOUT CONTRAST MRA HEAD WITHOUT CONTRAST TECHNIQUE: Axial and coronal DWI sequences of the head were acquired. Angiographic images of the head were obtained using MRA technique without contrast. COMPARISON:  01/06/2018 CT head FINDINGS: MRI HEAD FINDINGS Brain: No reduced  diffusion of the brain parenchyma to suggest acute or early subacute infarction. No gross mass effect, hydrocephalus, or herniation. Vascular: As below. Skull and upper cervical spine: No abnormal DWI signal of the calvarium. Sinuses/Orbits: Right greater than left maxillary sinus disease with a small right maxillary fluid level. No abnormal signal of mastoid air cells. Other: None. MRA HEAD FINDINGS Internal carotid arteries:  Patent. Anterior cerebral arteries:  Patent. Middle cerebral arteries: Patent. Anterior communicating artery: Patent. Posterior communicating  arteries:  Patent. Posterior cerebral arteries:  Patent. Basilar artery:  Patent. Vertebral arteries:  Patent. Motion degraded study, suboptimal assessment for small aneurysm or stenosis. No large aneurysm identified. IMPRESSION: 1. No diffusion abnormality to suggest acute or early subacute infarction. 2. Patent anterior and posterior intracranial circulation. Electronically Signed   By: Mitzi Hansen M.D.   On: 01/06/2018 19:50   Mr Brain Wo Contrast  Result Date: 01/06/2018 CLINICAL DATA:  57 y/o F; history of seizures on Dilantin presenting with left-sided facial droop and confusion. EXAM: MRI HEAD WITHOUT CONTRAST MRA HEAD WITHOUT CONTRAST TECHNIQUE: Axial and coronal DWI sequences of the head were acquired. Angiographic images of the head were obtained using MRA technique without contrast. COMPARISON:  01/06/2018 CT head FINDINGS: MRI HEAD FINDINGS Brain: No reduced diffusion of the brain parenchyma to suggest acute or early subacute infarction. No gross mass effect, hydrocephalus, or herniation. Vascular: As below. Skull and upper cervical spine: No abnormal DWI signal of the calvarium. Sinuses/Orbits: Right greater than left maxillary sinus disease with a small right maxillary fluid level. No abnormal signal of mastoid air cells. Other: None. MRA HEAD FINDINGS Internal carotid arteries:  Patent. Anterior cerebral arteries:  Patent. Middle cerebral arteries: Patent. Anterior communicating artery: Patent. Posterior communicating arteries:  Patent. Posterior cerebral arteries:  Patent. Basilar artery:  Patent. Vertebral arteries:  Patent. Motion degraded study, suboptimal assessment for small aneurysm or stenosis. No large aneurysm identified. IMPRESSION: 1. No diffusion abnormality to suggest acute or early subacute infarction. 2. Patent anterior and posterior intracranial circulation. Electronically Signed   By: Mitzi Hansen M.D.   On: 01/06/2018 19:50   Carotid Doppler   There is  1-39% bilateral ICA stenosis. Vertebral artery flow is antegrade.     PHYSICAL EXAM PER  Pleasant middle aged lady not in distress.  . Afebrile. Head is nontraumatic. Neck is supple without bruit.    Cardiac exam no murmur or gallop. Lungs are clear to auscultation. Distal pulses are well felt. Neurological Exam ;  Awake  Alert oriented x 3. Normal speech and language.eye movements full without nystagmus.fundi were not visualized. Vision acuity and fields appear normal. Hearing is normal. Palatal movements are normal. Face symmetric. Tongue midline. Normal strength, tone, reflexes and coordination on right side. Poor effort on left with subjective left sided weakness 3/5 but variable effort. Normal sensation. Gait deferred.  ASSESSMENT/PLAN Ms. Gloria Hurst is a 57 y.o. female with history of HTN, HLD, smoker, and obesity with a history of seizures presenting with left facial droop, left arm and leg weakness.   Stroke:  right subcortical infarct not seen on MRI secondary to small vessel disease source vs nonorganic etiology  CT head normal  MRI  No acute stroke  MRA  Unremarkable   Carotid Doppler  B ICA 1-39% stenosis, VAs antegrade   2D Echo  pending  LDL 100  HgbA1c 6.2  Lovenox 40 mg sq daily for VTE prophylaxis  No antithrombotic prior to admission, now on aspirin 81 mg daily and  clopidogrel 75 mg daily. Continue at discharge x 3 weeks then aspirin alone  Therapy recommendations:  CIR  Disposition:  Pending: son and grandson live with her  Hypertension  Stable . Permissive hypertension (OK if < 220/120) but gradually normalize in 5-7 days . Long-term BP goal normotensive  Hyperlipidemia  Home meds:  crestor 10  Increased to Lipitior 80 in hospital  LDL 100, goal < 70  Continue statin at discharge  Pre-diabetes type II  HgbA1c 6.2, goal < 7.0  Controlled  Other Stroke Risk Factors  Cigarette smoker, Dr. Pearlean BrownieSethi advised to stop smoking  ETOH use,  advised to drink no more than 1 drink a day  Obesity, Body mass index is 35.48 kg/m., recommend weight loss, diet and exercise as appropriate   Family hx stroke (father)  Other Active Problems  Seizures; last one many years ago; currently on dilantin  Hospital day # 1  Valentina LucksJessica Williams, MSN, NP-C [facility] [phone] I have personally examined this patient, reviewed notes, independently viewed imaging studies, participated in medical decision making and plan of care.ROS completed by me personally and pertinent positives fully documented  I have made any additions or clarifications directly to the above note. Agree with note above..She has presented with subjective left sided weakness with negative MRI and nonorganic features on her exam. Recommend aspirin and risk factor modification.D/w patient and Dr Thedore MinsSingh. I spent 25  minutes in total face-to-face time with the patient, more than 50% of which was spent in counseling and coordination of care, reviewing test results, reviewing medication and discussing or reviewing the diagnosis of    , the prognosis and treatment options.  Delia HeadyPramod Maela Takeda, MD Medical Director [facility] Pager: [phone] 01/08/2018 8:26 AM   To contact Stroke Continuity provider, please refer to [email or web address]. After hours, contact General Neurology

## 2018-01-07 NOTE — Discharge Summary (Signed)
Gloria Hurst XBJ:[id number] DOB: [birth year] DOA: 01/06/2018  PCP: [name]  Admit date: 01/06/2018  Discharge date: 01/07/2018  Admitted From: Home   Disposition:  Home   Recommendations for Outpatient Follow-up:   Follow up with PCP in 1-2 weeks  PCP Please obtain BMP/CBC, 2 view CXR in 1week,  (see Discharge instructions)   PCP Please follow up on the following pending results:    Home Health: PT,OT,RN   Equipment/Devices: 3 in 1  Consultations: Neuro Discharge Condition: Stable   CODE STATUS: Full   Diet Recommendation: Heart Healthy    Chief Complaint  Patient presents with  . Numbness     Brief history of present illness from the day of admission and additional interim summary    Gloria Hurst is a 57 y.o. female with medical history significant of HTN, HLD, seizure d/o, who woke up this morning around 0800 and noticed her face "felt funny" so she looked in the mirror and her face looked "crooked." Was hard for her to walk due to to BLE weakness, L>>R.                                                                  Hospital Course   1. L. Sided weakness -negative MRI with symptoms almost completely resolved, physical exam slightly inconsistent, case discussed with neurologist Dr. Pearlean Brownie, plan is to place her on aspirin, double home dose statin, counseled to quit smoking and alcohol, carotid stable, echo stable.  Discharge home with PT OT and equipment as needed.  Her main complaint now is left leg pain and discomfort, says her weakness is much improved and almost completely resolved.    2.  Dyslipidemia.  Crestor doubled.  3.  Hypertension.  Resume home medications from tomorrow.  4.  History of seizures.  Noncompliant with her Dilantin, counseled.  5. Mod.Pulm HTN - PCP to kindly  arrange sleep study and Pulmonary referral.   Discharge diagnosis     Principal Problem:   Left leg weakness Active Problems:   Altered mental state   Essential hypertension   GERD (gastroesophageal reflux disease)   Hyperlipidemia   Seizures (HCC)    Discharge instructions    Discharge Instructions    Diet - low sodium heart healthy   Complete by:  As directed    Discharge instructions   Complete by:  As directed    Follow with Primary MD [name] in 7 days   Get CBC, CMP,  checked  by Primary MD or SNF MD in 5-7 days    Activity: As tolerated with Full fall precautions use walker/cane & assistance as needed  Disposition Home    Diet:  Heart Healthy    For Heart failure patients - Check your Weight same time everyday, if  you gain over 2 pounds, or you develop in leg swelling, experience more shortness of breath or chest pain, call your Primary MD immediately. Follow Cardiac Low Salt Diet and 1.5 lit/day fluid restriction.  Special Instructions: If you have smoked or chewed Tobacco  in the last 2 yrs please stop smoking, stop any regular Alcohol  and or any Recreational drug use.  On your next visit with your primary care physician please Get Medicines reviewed and adjusted.  Please request your Prim.MD to go over all Hospital Tests and Procedure/Radiological results at the follow up, please get all Hospital records sent to your Prim MD by signing hospital release before you go home.  If you experience worsening of your admission symptoms, develop shortness of breath, life threatening emergency, suicidal or homicidal thoughts you must seek medical attention immediately by calling 911 or calling your MD immediately  if symptoms less severe.  You Must read complete instructions/literature along with all the possible adverse reactions/side effects for all the Medicines you take and that have been prescribed to you. Take any new Medicines after you have  completely understood and accpet all the possible adverse reactions/side effects.   Increase activity slowly   Complete by:  As directed       Discharge Medications   Allergies as of 01/07/2018      Reactions   Bee Venom Anaphylaxis, Shortness Of Breath   Strawberry Extract Hives   Penicillins Rash   Has patient had a PCN reaction causing immediate rash, facial/tongue/throat swelling, SOB or lightheadedness with hypotension: Yes Has patient had a PCN reaction causing severe rash involving mucus membranes or skin necrosis: No Has patient had a PCN reaction that required hospitalization: No Has patient had a PCN reaction occurring within the last 10 years: No If all of the above answers are "NO", then may proceed with Cephalosporin use.      Medication List    STOP taking these medications   ibuprofen 600 MG tablet Commonly known as:  ADVIL,MOTRIN     TAKE these medications   aspirin 81 MG EC tablet Take 1 tablet (81 mg total) by mouth daily. Start taking on:  01/08/2018   BENADRYL EX Apply 1 application topically daily as needed (itching).   budesonide-formoterol 80-4.5 MCG/ACT inhaler Commonly known as:  SYMBICORT Inhale 2 puffs into the lungs 2 (two) times daily.   cholecalciferol 400 units Tabs tablet Commonly known as:  VITAMIN D Take 400 Units by mouth daily.   DEXILANT 60 MG capsule Generic drug:  dexlansoprazole Take 60 mg by mouth daily.   DILANTIN 100 MG ER capsule Generic drug:  phenytoin Take 400 mg by mouth daily.   etodolac 400 MG tablet Commonly known as:  LODINE Take 400 mg by mouth 2 (two) times daily as needed (joint pain/stiffness).   irbesartan-hydrochlorothiazide 300-12.5 MG tablet Commonly known as:  AVALIDE Take 1 tablet by mouth daily.   metoprolol succinate 25 MG 24 hr tablet Commonly known as:  TOPROL-XL Take 25 mg by mouth daily.   montelukast 10 MG tablet Commonly known as:  SINGULAIR Take 10 mg by mouth at bedtime.     multivitamin with minerals Tabs tablet Take 1 tablet by mouth daily. Centrum   Oxycodone HCl 10 MG Tabs Take 10 mg by mouth 4 (four) times daily as needed (pain).   PRESCRIPTION MEDICATION Inhale into the lungs at bedtime. CPAP   PROVENTIL HFA 108 (90 Base) MCG/ACT inhaler Generic drug:  albuterol Inhale 2 puffs  into the lungs every 6 (six) hours as needed for wheezing or shortness of breath.   rosuvastatin 20 MG tablet Commonly known as:  CRESTOR Take 1 tablet (20 mg total) by mouth daily. What changed:    medication strength  how much to take       Follow-up Information    [facility]. Schedule an appointment as soon as possible for a visit in 1 week(s).   Contact information: [address] [place] [address] [phone]        Micki Riley, MD. Schedule an appointment as soon as possible for a visit in 3 week(s).   Specialties:  Neurology, Radiology Why:  TIA Contact information: [address] [place] [address] [phone]           Major procedures and Radiology Reports - PLEASE review detailed and final reports thoroughly  -     Carotids -  Carotid artery duplex has been completed.  Bilateral internal carotid arteries are 1-39%. Vertebral arteries are patent with antegrade flow.   TTE  -  - Left ventricle: The cavity size was normal. Wall thickness was   increased in a pattern of moderate LVH. Systolic function was   normal. The estimated ejection fraction was in the range of 50%   to 55%. Wall motion was normal; there were no regional wall   motion abnormalities. Doppler parameters are consistent with a   reversible restrictive pattern, indicative of decreased left   ventricular diastolic compliance and/or increased left atrial   pressure (grade 3 diastolic dysfunction). - Pulmonary arteries: Systolic pressure was moderately increased.   PA peak pressure: 37 mm Hg (S).  Impressions:  - The right  ventricular systolic pressure was increased consistent   with moderate pulmonary hypertension. Normal LV ejection fraction   and wall motion, with diastolic dysfunction.   Dg Chest 2 View  Result Date: 01/06/2018 CLINICAL DATA:  Left-sided chest pain.  Smoker. EXAM: CHEST - 2 VIEW COMPARISON:  09/02/2017 FINDINGS: The heart size and mediastinal contours are within normal limits. Both lungs are clear. The visualized skeletal structures are unremarkable. IMPRESSION: No active cardiopulmonary disease. Electronically Signed   By: Elige Ko   On: 01/06/2018 10:07   Dg Elbow Complete Left  Result Date: 01/06/2018 CLINICAL DATA:  Left elbow pain. EXAM: LEFT ELBOW - COMPLETE 3+ VIEW COMPARISON:  None. FINDINGS: There is no evidence of fracture, dislocation, or joint effusion. There is no evidence of arthropathy or other focal bone abnormality. Soft tissues are unremarkable. IMPRESSION: Negative. Electronically Signed   By: Elige Ko   On: 01/06/2018 10:07   Ct Head Wo Contrast  Result Date: 01/06/2018 CLINICAL DATA:  Awoke with left-sided facial droop and facial numbness. EXAM: CT HEAD WITHOUT CONTRAST TECHNIQUE: Contiguous axial images were obtained from the base of the skull through the vertex without intravenous contrast. COMPARISON:  Head CT 03/14/2016 FINDINGS: Brain: The brain shows a normal appearance without evidence of malformation, atrophy, old or acute small or large vessel infarction, mass lesion, hemorrhage, hydrocephalus or extra-axial collection. Vascular: No hyperdense vessel. No evidence of atherosclerotic calcification. Skull: Normal.  No traumatic finding.  No focal bone lesion. Sinuses/Orbits: Sinuses are clear. Orbits appear normal. Mastoids are clear. Other: None significant IMPRESSION: Normal head CT Electronically Signed   By: Paulina Fusi M.D.   On: 01/06/2018 09:58   Mr Maxine Glenn Head Wo Contrast  Result Date: 01/06/2018 CLINICAL DATA:  57 y/o F; history of seizures  on Dilantin  presenting with left-sided facial droop and confusion. EXAM: MRI HEAD WITHOUT CONTRAST MRA HEAD WITHOUT CONTRAST TECHNIQUE: Axial and coronal DWI sequences of the head were acquired. Angiographic images of the head were obtained using MRA technique without contrast. COMPARISON:  01/06/2018 CT head FINDINGS: MRI HEAD FINDINGS Brain: No reduced diffusion of the brain parenchyma to suggest acute or early subacute infarction. No gross mass effect, hydrocephalus, or herniation. Vascular: As below. Skull and upper cervical spine: No abnormal DWI signal of the calvarium. Sinuses/Orbits: Right greater than left maxillary sinus disease with a small right maxillary fluid level. No abnormal signal of mastoid air cells. Other: None. MRA HEAD FINDINGS Internal carotid arteries:  Patent. Anterior cerebral arteries:  Patent. Middle cerebral arteries: Patent. Anterior communicating artery: Patent. Posterior communicating arteries:  Patent. Posterior cerebral arteries:  Patent. Basilar artery:  Patent. Vertebral arteries:  Patent. Motion degraded study, suboptimal assessment for small aneurysm or stenosis. No large aneurysm identified. IMPRESSION: 1. No diffusion abnormality to suggest acute or early subacute infarction. 2. Patent anterior and posterior intracranial circulation. Electronically Signed   By: Mitzi Hansen M.D.   On: 01/06/2018 19:50   Mr Brain Wo Contrast  Result Date: 01/06/2018 CLINICAL DATA:  57 y/o F; history of seizures on Dilantin presenting with left-sided facial droop and confusion. EXAM: MRI HEAD WITHOUT CONTRAST MRA HEAD WITHOUT CONTRAST TECHNIQUE: Axial and coronal DWI sequences of the head were acquired. Angiographic images of the head were obtained using MRA technique without contrast. COMPARISON:  01/06/2018 CT head FINDINGS: MRI HEAD FINDINGS Brain: No reduced diffusion of the brain parenchyma to suggest acute or early subacute infarction. No gross mass effect, hydrocephalus, or  herniation. Vascular: As below. Skull and upper cervical spine: No abnormal DWI signal of the calvarium. Sinuses/Orbits: Right greater than left maxillary sinus disease with a small right maxillary fluid level. No abnormal signal of mastoid air cells. Other: None. MRA HEAD FINDINGS Internal carotid arteries:  Patent. Anterior cerebral arteries:  Patent. Middle cerebral arteries: Patent. Anterior communicating artery: Patent. Posterior communicating arteries:  Patent. Posterior cerebral arteries:  Patent. Basilar artery:  Patent. Vertebral arteries:  Patent. Motion degraded study, suboptimal assessment for small aneurysm or stenosis. No large aneurysm identified. IMPRESSION: 1. No diffusion abnormality to suggest acute or early subacute infarction. 2. Patent anterior and posterior intracranial circulation. Electronically Signed   By: Mitzi Hansen M.D.   On: 01/06/2018 19:50    Micro Results     No results found for this or any previous visit (from the past 240 hour(s)).  Today   Subjective    Lourdez Hyun today has no headache,no chest abdominal pain,no new weakness tingling or numbness, feels much better wants to go home today.     Objective   Blood pressure (!) 155/72, pulse (!) 52, temperature 98.4 F (36.9 C), temperature source Oral, resp. rate 20, height 5\' 2"  (1.575 m), weight 88 kg (194 lb), last menstrual period 03/26/2012, SpO2 99 %.   Intake/Output Summary (Last 24 hours) at 01/07/2018 1341 Last data filed at 01/07/2018 1137 Gross per 24 hour  Intake 1224.39 ml  Output 1300 ml  Net -75.61 ml    Exam Awake Alert, Oriented x 3, No new F.N deficits, mild L. Sided leg pain,  Normal affect .AT,PERRAL Supple Neck,No JVD, No cervical lymphadenopathy appriciated.  Symmetrical Chest wall movement, Good air movement bilaterally, CTAB RRR,No Gallops,Rubs or new Murmurs, No Parasternal Heave +ve B.Sounds, Abd Soft, Non tender, No organomegaly appriciated, No rebound  -  guarding or rigidity. No Cyanosis, Clubbing or edema, No new Rash or bruise   Data Review   CBC w Diff:  Lab Results  Component Value Date   WBC 8.0 01/06/2018   HGB 13.9 01/06/2018   HCT 40.4 01/06/2018   PLT 213 01/06/2018   LYMPHOPCT 39 01/06/2018   MONOPCT 9 01/06/2018   EOSPCT 6 01/06/2018   BASOPCT 1 01/06/2018    CMP:  Lab Results  Component Value Date   NA 135 01/06/2018   K 3.8 01/06/2018   CL 100 01/06/2018   CO2 26 01/06/2018   BUN 19 01/06/2018   CREATININE 0.71 01/06/2018   PROT 7.2 01/06/2018   ALBUMIN 3.9 01/06/2018   BILITOT 0.7 01/06/2018   ALKPHOS 86 01/06/2018   AST 21 01/06/2018   ALT 18 01/06/2018  . Lab Results  Component Value Date   CHOL 172 01/07/2018   HDL 46 01/07/2018   LDLCALC 100 (H) 01/07/2018   TRIG 132 01/07/2018   CHOLHDL 3.7 01/07/2018   Lab Results  Component Value Date   HGBA1C 6.2 (H) 01/07/2018     Total Time in preparing paper work, data evaluation and todays exam - 35 minutes  Susa RaringPrashant Singh M.D on 01/07/2018 at 1:41 PM  [facility]   Office  [phone]

## 2018-01-07 NOTE — Progress Notes (Signed)
  Echocardiogram 2D Echocardiogram has been performed.  Pieter PartridgeBrooke S Adilen Pavelko 01/07/2018, 10:07 AM

## 2018-01-07 NOTE — Discharge Instructions (Signed)
Follow with [name] in 7 days   Get CBC, CMP,  checked  by Primary MD or SNF MD in 5-7 days    Activity: As tolerated with Full fall precautions use walker/cane & assistance as needed  Disposition Home    Diet:  Heart Healthy    For Heart failure patients - Check your Weight same time everyday, if you gain over 2 pounds, or you develop in leg swelling, experience more shortness of breath or chest pain, call your Primary MD immediately. Follow Cardiac Low Salt Diet and 1.5 lit/day fluid restriction.  Special Instructions: If you have smoked or chewed Tobacco  in the last 2 yrs please stop smoking, stop any regular Alcohol  and or any Recreational drug use.  On your next visit with your primary care physician please Get Medicines reviewed and adjusted.  Please request your Prim.MD to go over all Hospital Tests and Procedure/Radiological results at the follow up, please get all Hospital records sent to your Prim MD by signing hospital release before you go home.  If you experience worsening of your admission symptoms, develop shortness of breath, life threatening emergency, suicidal or homicidal thoughts you must seek medical attention immediately by calling 911 or calling your MD immediately  if symptoms less severe.  You Must read complete instructions/literature along with all the possible adverse reactions/side effects for all the Medicines you take and that have been prescribed to you. Take any new Medicines after you have completely understood and accpet all the possible adverse reactions/side effects.

## 2020-03-03 ENCOUNTER — Emergency Department (HOSPITAL_BASED_OUTPATIENT_CLINIC_OR_DEPARTMENT_OTHER): Payer: Medicare Other

## 2020-03-03 ENCOUNTER — Other Ambulatory Visit: Payer: Self-pay

## 2020-03-03 ENCOUNTER — Emergency Department (HOSPITAL_BASED_OUTPATIENT_CLINIC_OR_DEPARTMENT_OTHER)
Admission: EM | Admit: 2020-03-03 | Discharge: 2020-03-03 | Disposition: A | Discharge status: Home / Self Care | Payer: Medicare Other | Attending: Emergency Medicine | Admitting: Emergency Medicine

## 2020-03-03 ENCOUNTER — Encounter (HOSPITAL_BASED_OUTPATIENT_CLINIC_OR_DEPARTMENT_OTHER): Payer: Self-pay | Admitting: *Deleted

## 2020-03-03 DIAGNOSIS — Z7982 Long term (current) use of aspirin: Secondary | ICD-10-CM | POA: Diagnosis not present

## 2020-03-03 DIAGNOSIS — K219 Gastro-esophageal reflux disease without esophagitis: Secondary | ICD-10-CM | POA: Insufficient documentation

## 2020-03-03 DIAGNOSIS — Z79899 Other long term (current) drug therapy: Secondary | ICD-10-CM | POA: Diagnosis not present

## 2020-03-03 DIAGNOSIS — F17210 Nicotine dependence, cigarettes, uncomplicated: Secondary | ICD-10-CM | POA: Insufficient documentation

## 2020-03-03 DIAGNOSIS — R112 Nausea with vomiting, unspecified: Secondary | ICD-10-CM | POA: Insufficient documentation

## 2020-03-03 DIAGNOSIS — M549 Dorsalgia, unspecified: Secondary | ICD-10-CM | POA: Diagnosis not present

## 2020-03-03 DIAGNOSIS — R1011 Right upper quadrant pain: Secondary | ICD-10-CM | POA: Diagnosis not present

## 2020-03-03 DIAGNOSIS — R1010 Upper abdominal pain, unspecified: Secondary | ICD-10-CM

## 2020-03-03 DIAGNOSIS — I10 Essential (primary) hypertension: Secondary | ICD-10-CM | POA: Diagnosis not present

## 2020-03-03 LAB — CBC WITH DIFFERENTIAL/PLATELET
Abs Immature Granulocytes: 0.04 10*3/uL (ref 0.00–0.07)
Basophils Absolute: 0.1 10*3/uL (ref 0.0–0.1)
Basophils Relative: 1 %
Eosinophils Absolute: 0.3 10*3/uL (ref 0.0–0.5)
Eosinophils Relative: 3 %
HCT: 40.9 % (ref 36.0–46.0)
Hemoglobin: 13.4 g/dL (ref 12.0–15.0)
Immature Granulocytes: 0 %
Lymphocytes Relative: 36 %
Lymphs Abs: 3.3 10*3/uL (ref 0.7–4.0)
MCH: 27.5 pg (ref 26.0–34.0)
MCHC: 32.8 g/dL (ref 30.0–36.0)
MCV: 84 fL (ref 80.0–100.0)
Monocytes Absolute: 0.7 10*3/uL (ref 0.1–1.0)
Monocytes Relative: 8 %
Neutro Abs: 4.9 10*3/uL (ref 1.7–7.7)
Neutrophils Relative %: 52 %
Platelets: 223 10*3/uL (ref 150–400)
RBC: 4.87 MIL/uL (ref 3.87–5.11)
RDW: 14.7 % (ref 11.5–15.5)
WBC: 9.4 10*3/uL (ref 4.0–10.5)
nRBC: 0 % (ref 0.0–0.2)

## 2020-03-03 LAB — COMPREHENSIVE METABOLIC PANEL
ALT: 23 U/L (ref 0–44)
AST: 22 U/L (ref 15–41)
Albumin: 4.2 g/dL (ref 3.5–5.0)
Alkaline Phosphatase: 65 U/L (ref 38–126)
Anion gap: 10 (ref 5–15)
BUN: 9 mg/dL (ref 6–20)
CO2: 25 mmol/L (ref 22–32)
Calcium: 9.1 mg/dL (ref 8.9–10.3)
Chloride: 100 mmol/L (ref 98–111)
Creatinine, Ser: 0.69 mg/dL (ref 0.44–1.00)
GFR calc Af Amer: >60 mL/min (ref >60)
GFR calc non Af Amer: >60 mL/min (ref >60)
Glucose, Bld: 107 mg/dL — ABNORMAL HIGH (ref 70–99)
Potassium: 3.9 mmol/L (ref 3.5–5.1)
Sodium: 135 mmol/L (ref 135–145)
Total Bilirubin: 0.7 mg/dL (ref 0.3–1.2)
Total Protein: 7.5 g/dL (ref 6.5–8.1)

## 2020-03-03 LAB — URINALYSIS, ROUTINE W REFLEX MICROSCOPIC
Bilirubin Urine: NEGATIVE
Glucose, UA: NEGATIVE mg/dL
Hgb urine dipstick: NEGATIVE
Ketones, ur: NEGATIVE mg/dL
Leukocytes,Ua: NEGATIVE
Nitrite: NEGATIVE
Protein, ur: NEGATIVE mg/dL
Specific Gravity, Urine: 1.01 (ref 1.005–1.030)
pH: 7 (ref 5.0–8.0)

## 2020-03-03 LAB — TROPONIN I (HIGH SENSITIVITY)
Troponin I (High Sensitivity): 3 ng/L (ref <18)
Troponin I (High Sensitivity): 4 ng/L (ref <18)

## 2020-03-03 LAB — LIPASE, BLOOD: Lipase: 31 U/L (ref 11–51)

## 2020-03-03 MED ORDER — HYDROMORPHONE HCL 1 MG/ML IJ SOLN
1.0000 mg | Freq: Once | INTRAMUSCULAR | Status: AC
  Administered 2020-03-03: 1 mg via INTRAVENOUS
  Filled 2020-03-03: qty 1

## 2020-03-03 MED ORDER — IOHEXOL 300 MG/ML  SOLN
100.0000 mL | Freq: Once | INTRAMUSCULAR | Status: AC | PRN
  Administered 2020-03-03: 100 mL via INTRAVENOUS

## 2020-03-03 MED ORDER — SODIUM CHLORIDE 0.9 % IV BOLUS
1000.0000 mL | Freq: Once | INTRAVENOUS | Status: AC
  Administered 2020-03-03: 1000 mL via INTRAVENOUS

## 2020-03-03 MED ORDER — FAMOTIDINE 20 MG PO TABS: 20.0000 mg | ORAL_TABLET | Freq: Two times a day (BID) | ORAL | 0 refills | Status: DC

## 2020-03-03 MED ORDER — ONDANSETRON 4 MG PO TBDP: 4.0000 mg | ORAL_TABLET | Freq: Three times a day (TID) | ORAL | 0 refills | Status: DC | PRN

## 2020-03-03 NOTE — ED Provider Notes (Signed)
[facility] EMERGENCY DEPARTMENT Provider Note   CSN: [id number] Arrival date & time: 03/03/20  1050     History Chief Complaint  Patient presents with  . Abdominal Pain    Gloria Hurst is a 59 y.o. female.  HPI 59 year old female presents with right-sided abdominal pain.  Ongoing for about 3 days.  It is constant and about a 9 out of 10.  Gets nauseous and vomiting when she tries to eat.  The pain is going into her right back.  She does think her urine has a little bit of a foul odor as well.  However no dysuria.  Sent over from PCPs office for ultrasound of her gallbladder.  Past Medical History:  Diagnosis Date  . Acid reflux   . Back pain   . Epilepsy (HCC)   . High cholesterol   . HTN (hypertension)   . Obesity     Patient Active Problem List   Diagnosis Date Noted  . Altered mental state 01/06/2018  . GERD (gastroesophageal reflux disease) 01/06/2018  . Gum disease 01/06/2018  . Shortness of breath 01/06/2018  . Sleep apnea 01/06/2018  . Left leg weakness 01/06/2018  . Epigastric pain 08/31/2015  . Cholecystitis 08/30/2015  . Duodenitis 08/30/2015  . Essential hypertension 08/30/2015  . Hematochezia 08/30/2015  . Hyperlipidemia 08/30/2015  . Seizures (HCC) 08/30/2015  . Bilateral carpal tunnel syndrome 03/24/2014  . Low back pain 02/04/2014  . Lumbar radiculopathy 02/04/2014  . Lumbar spondylosis 02/04/2014  . Right knee pain 02/04/2014    Past Surgical History:  Procedure Laterality Date  . APPENDECTOMY    . BREAST REDUCTION SURGERY    . CARPAL TUNNEL RELEASE    . CESAREAN SECTION       OB History   No obstetric history on file.     Family History  Problem Relation Age of Onset  . CAD Mother 25       Died at 36 of MI  . CVA Father 59  . Breast cancer Sister   . Bladder Cancer Maternal Aunt        Died of bladder cancer    Social History   Tobacco Use  . Smoking status: Current Every Day Smoker    Packs/day: 0.30    Years:  37.00    Pack years: 11.10    Types: Cigarettes  . Smokeless tobacco: Never Used  Substance Use Topics  . Alcohol use: Yes    Comment: 2 drinks per weekend  . Drug use: No    Home Medications Prior to Admission medications   Medication Sig Start Date End Date Taking? Authorizing Provider  albuterol (PROVENTIL HFA) 108 (90 Base) MCG/ACT inhaler Inhale 2 puffs into the lungs every 6 (six) hours as needed for wheezing or shortness of breath.    Yes [provider]  amLODipine (NORVASC) 5 MG tablet TK 1 T PO D 05/04/19  Yes [provider]  aspirin 81 MG chewable tablet Chew by mouth.   Yes [provider]  cholecalciferol (VITAMIN D) 400 units TABS tablet Take 400 Units by mouth daily.   Yes [provider]  dexlansoprazole (DEXILANT) 60 MG capsule Take 60 mg by mouth daily.   Yes [provider]  etodolac (LODINE) 400 MG tablet Take 400 mg by mouth 2 (two) times daily as needed (joint pain/stiffness).    Yes [provider]  etodolac (LODINE) 400 MG tablet Take by mouth. 11/07/17  Yes [provider]  metoprolol  succinate (TOPROL-XL) 25 MG 24 hr tablet Take 25 mg by mouth daily.  12/02/17  Yes [provider]  montelukast (SINGULAIR) 10 MG tablet Take 10 mg by mouth at bedtime.   Yes [provider]  Multiple Vitamin (MULTIVITAMIN WITH MINERALS) TABS tablet Take 1 tablet by mouth daily. Centrum   Yes [provider]  phenytoin (DILANTIN) 100 MG ER capsule Take 400 mg by mouth daily.   Yes [provider]  rosuvastatin (CRESTOR) 20 MG tablet Take 1 tablet (20 mg total) by mouth daily. 01/07/18  Yes Leroy Sea, MD  aspirin EC 81 MG EC tablet Take 1 tablet (81 mg total) by mouth daily. 01/08/18   Leroy Sea, MD  budesonide-formoterol (SYMBICORT) 80-4.5 MCG/ACT inhaler Inhale 2 puffs into the lungs 2 (two) times daily.    [provider]  diphenhydrAMINE-Zinc Acetate (BENADRYL EX)  Apply 1 application topically daily as needed (itching).    [provider]  famotidine (PEPCID) 20 MG tablet Take 1 tablet (20 mg total) by mouth 2 (two) times daily. 03/03/20   Pricilla Loveless, MD  irbesartan-hydrochlorothiazide (AVALIDE) 300-12.5 MG tablet Take 1 tablet by mouth daily.    [provider]  ondansetron (ZOFRAN ODT) 4 MG disintegrating tablet Take 1 tablet (4 mg total) by mouth every 8 (eight) hours as needed for nausea or vomiting. 03/03/20   Pricilla Loveless, MD  Oxycodone HCl 10 MG TABS Take 10 mg by mouth 4 (four) times daily as needed (pain).  12/15/17   [provider]  PRESCRIPTION MEDICATION Inhale into the lungs at bedtime. CPAP    [provider]    Allergies    Bee venom, Strawberry extract, and Penicillins  Review of Systems   Review of Systems  Constitutional: Negative for fever.  Gastrointestinal: Positive for abdominal pain, nausea and vomiting.  Genitourinary: Negative for dysuria.  Musculoskeletal: Positive for back pain.  All other systems reviewed and are negative.   Physical Exam Updated Vital Signs BP (!) 169/85   Pulse (!) 51   Temp 98.3 F (36.8 C) (Oral)   Resp 15   Ht 5\' 2"  (1.575 m)   Wt 94.3 kg   LMP 03/26/2012   SpO2 99%   BMI 38.04 kg/m   Physical Exam Vitals and nursing note reviewed.  Constitutional:      Appearance: She is well-developed.  HENT:     Head: Normocephalic and atraumatic.     Right Ear: External ear normal.     Left Ear: External ear normal.     Nose: Nose normal.  Eyes:     General:        Right eye: No discharge.        Left eye: No discharge.  Cardiovascular:     Rate and Rhythm: Normal rate and regular rhythm.     Heart sounds: Normal heart sounds.  Pulmonary:     Effort: Pulmonary effort is normal.     Breath sounds: Normal breath sounds.  Abdominal:     Palpations: Abdomen is soft.     Tenderness: There is abdominal tenderness in the right upper quadrant, epigastric  area and left upper quadrant.  Skin:    General: Skin is warm and dry.  Neurological:     Mental Status: She is alert.  Psychiatric:        Mood and Affect: Mood is not anxious.     ED Results / Procedures / Treatments   Labs (all labs ordered  are listed, but only abnormal results are displayed) Labs Reviewed  COMPREHENSIVE METABOLIC PANEL - Abnormal; Notable for the following components:      Result Value   Glucose, Bld 107 (*)    All other components within normal limits  LIPASE, BLOOD  CBC WITH DIFFERENTIAL/PLATELET  URINALYSIS, ROUTINE W REFLEX MICROSCOPIC  TROPONIN I (HIGH SENSITIVITY)    EKG EKG Interpretation  Date/Time:  Thursday March 03 2020 15:10:14 EDT Ventricular Rate:  50 PR Interval:    QRS Duration: 99 QT Interval:  496 QTC Calculation: 453 R Axis:   64 Text Interpretation: Sinus or ectopic atrial rhythm Short PR interval Nonspecific T abnormalities, inferior leads T wave changes new since July 2019, but similar to mar 2019 Confirmed by Pricilla LovelessGoldston, Jammie Clink [phone]([id number]) on 03/03/2020 3:19:49 PM   Radiology CT ABDOMEN PELVIS W CONTRAST  Result Date: 03/03/2020 CLINICAL DATA:  Right upper quadrant pain for 3 days. EXAM: CT ABDOMEN AND PELVIS WITH CONTRAST TECHNIQUE: Multidetector CT imaging of the abdomen and pelvis was performed using the standard protocol following bolus administration of intravenous contrast. CONTRAST:  100mL OMNIPAQUE IOHEXOL 300 MG/ML  SOLN COMPARISON:  08/30/2015 FINDINGS: Lower chest: Clear lung bases. Hepatobiliary: Normal liver. Gallbladder has surgically removed since the prior CT. No bile duct dilation. Pancreas: Unremarkable. No pancreatic ductal dilatation or surrounding inflammatory changes. Spleen: Normal in size without focal abnormality. Adrenals/Urinary Tract: No adrenal masses. Kidneys normal size, orientation and position with symmetric enhancement and excretion. Subcentimeter low-density cortical lesion, upper pole the right kidney,  consistent with a cyst but too small fully characterize no other renal masses or lesions, no stones and no hydronephrosis. Normal ureters. Normal bladder. Stomach/Bowel: Small hiatal hernia, stable. Stomach otherwise unremarkable. Small bowel and colon within normal limits. Vascular/Lymphatic: Mild aortic atherosclerosis. No aneurysm. No enlarged lymph nodes. Reproductive: Uterus and bilateral adnexa are unremarkable. Other: No abdominal wall hernia or abnormality. No abdominopelvic ascites. Musculoskeletal: No acute or significant osseous findings. IMPRESSION: 1. No acute findings. 2. Status post cholecystectomy.  No bile duct dilation. 3. Mild aortic atherosclerosis.  Small hiatal hernia. Electronically Signed   By: Amie Portlandavid  Ormond M.D.   On: 03/03/2020 14:32    Procedures Procedures (including critical care time)  Medications Ordered in ED Medications  sodium chloride 0.9 % bolus 1,000 mL (0 mLs Intravenous Stopped 03/03/20 1514)  HYDROmorphone (DILAUDID) injection 1 mg (1 mg Intravenous Given 03/03/20 1306)  iohexol (OMNIPAQUE) 300 MG/ML solution 100 mL (100 mLs Intravenous Contrast Given 03/03/20 1401)    ED Course  I have reviewed the triage vital signs and the nursing notes.  Pertinent labs & imaging results that were available during my care of the patient were reviewed by me and considered in my medical decision making (see chart for details).  Clinical Course as of Mar 04 1539  Thu Mar 03, 2020  1252 Ultrasound is not available today at this facility. Given this, will get CT   [SG]    Clinical Course User Index [SG] Pricilla LovelessGoldston, Blakelynn Scheeler, MD   MDM Rules/Calculators/A&P                          Patient was sent over for possible cholecystitis.  However CT shows that she has previously had her gallbladder taken out.  CT is otherwise unremarkable.  Unclear cause, probably gastritis.  However given her age, ECG was also obtained which shows nonspecific T waves.  She has had these before but they  have also  been normal before.  We will get a troponin.  If this troponin is negative I think ACS is unlikely and she can go home. Care to Dr. Renaye Rakers. Final Clinical Impression(s) / ED Diagnoses Final diagnoses:  Upper abdominal pain    Rx / DC Orders ED Discharge Orders         Ordered    famotidine (PEPCID) 20 MG tablet  2 times daily        03/03/20 1522    ondansetron (ZOFRAN ODT) 4 MG disintegrating tablet  Every 8 hours PRN        03/03/20 1522           Pricilla Loveless, MD 03/03/20 1540

## 2020-03-03 NOTE — ED Triage Notes (Signed)
Right upper quadrant pain x 3 days. She was seen at [facility] this am and sent here for further testing after suspected gallstones.

## 2020-03-03 NOTE — Discharge Instructions (Addendum)
If you develop worsening, continued, or recurrent abdominal pain, uncontrolled vomiting, fever, chest or back pain, or any other new/concerning symptoms then return to the ER for evaluation.  

## 2020-03-03 NOTE — ED Notes (Signed)
ED Provider at bedside. 

## 2020-09-09 ENCOUNTER — Other Ambulatory Visit: Payer: Self-pay

## 2020-09-09 DIAGNOSIS — Z7982 Long term (current) use of aspirin: Secondary | ICD-10-CM | POA: Diagnosis not present

## 2020-09-09 DIAGNOSIS — K219 Gastro-esophageal reflux disease without esophagitis: Secondary | ICD-10-CM | POA: Insufficient documentation

## 2020-09-09 DIAGNOSIS — R110 Nausea: Secondary | ICD-10-CM | POA: Diagnosis not present

## 2020-09-09 DIAGNOSIS — R197 Diarrhea, unspecified: Secondary | ICD-10-CM | POA: Insufficient documentation

## 2020-09-09 DIAGNOSIS — F17210 Nicotine dependence, cigarettes, uncomplicated: Secondary | ICD-10-CM | POA: Diagnosis not present

## 2020-09-09 DIAGNOSIS — I10 Essential (primary) hypertension: Secondary | ICD-10-CM | POA: Diagnosis not present

## 2020-09-09 DIAGNOSIS — Z79899 Other long term (current) drug therapy: Secondary | ICD-10-CM | POA: Insufficient documentation

## 2020-09-09 DIAGNOSIS — R1013 Epigastric pain: Secondary | ICD-10-CM | POA: Diagnosis present

## 2020-09-09 NOTE — ED Triage Notes (Signed)
Pt is c/o abd pain that started earlier tonight  Pt states the pain radiates into her back  Pt states she feels light headed  Pt states the pain is making it hard to breathe  Pt states she ate soup for supper   Pt has had diarrhea and nausea but no vomiting

## 2020-09-10 ENCOUNTER — Emergency Department (HOSPITAL_BASED_OUTPATIENT_CLINIC_OR_DEPARTMENT_OTHER): Payer: Medicare Other

## 2020-09-10 ENCOUNTER — Emergency Department (HOSPITAL_BASED_OUTPATIENT_CLINIC_OR_DEPARTMENT_OTHER)
Admission: EM | Admit: 2020-09-10 | Discharge: 2020-09-10 | Disposition: A | Discharge status: Home / Self Care | Payer: Medicare Other | Attending: Emergency Medicine | Admitting: Emergency Medicine

## 2020-09-10 ENCOUNTER — Encounter (HOSPITAL_BASED_OUTPATIENT_CLINIC_OR_DEPARTMENT_OTHER): Payer: Self-pay | Admitting: Emergency Medicine

## 2020-09-10 DIAGNOSIS — R1013 Epigastric pain: Secondary | ICD-10-CM

## 2020-09-10 HISTORY — DX: Cerebral infarction, unspecified: I63.9

## 2020-09-10 LAB — COMPREHENSIVE METABOLIC PANEL
ALT: 27 U/L (ref 0–44)
AST: 26 U/L (ref 15–41)
Albumin: 4 g/dL (ref 3.5–5.0)
Alkaline Phosphatase: 81 U/L (ref 38–126)
Anion gap: 10 (ref 5–15)
BUN: 12 mg/dL (ref 6–20)
CO2: 26 mmol/L (ref 22–32)
Calcium: 8.9 mg/dL (ref 8.9–10.3)
Chloride: 101 mmol/L (ref 98–111)
Creatinine, Ser: 0.76 mg/dL (ref 0.44–1.00)
GFR, Estimated: >60 mL/min (ref >60)
Glucose, Bld: 127 mg/dL — ABNORMAL HIGH (ref 70–99)
Potassium: 3.8 mmol/L (ref 3.5–5.1)
Sodium: 137 mmol/L (ref 135–145)
Total Bilirubin: 0.3 mg/dL (ref 0.3–1.2)
Total Protein: 7.2 g/dL (ref 6.5–8.1)

## 2020-09-10 LAB — CBC WITH DIFFERENTIAL/PLATELET
Abs Immature Granulocytes: 0.03 10*3/uL (ref 0.00–0.07)
Basophils Absolute: 0.1 10*3/uL (ref 0.0–0.1)
Basophils Relative: 1 %
Eosinophils Absolute: 0.4 10*3/uL (ref 0.0–0.5)
Eosinophils Relative: 5 %
HCT: 38.9 % (ref 36.0–46.0)
Hemoglobin: 12.9 g/dL (ref 12.0–15.0)
Immature Granulocytes: 0 %
Lymphocytes Relative: 28 %
Lymphs Abs: 2.6 10*3/uL (ref 0.7–4.0)
MCH: 27.4 pg (ref 26.0–34.0)
MCHC: 33.2 g/dL (ref 30.0–36.0)
MCV: 82.6 fL (ref 80.0–100.0)
Monocytes Absolute: 0.8 10*3/uL (ref 0.1–1.0)
Monocytes Relative: 9 %
Neutro Abs: 5.2 10*3/uL (ref 1.7–7.7)
Neutrophils Relative %: 57 %
Platelets: 206 10*3/uL (ref 150–400)
RBC: 4.71 MIL/uL (ref 3.87–5.11)
RDW: 14.9 % (ref 11.5–15.5)
WBC: 9.1 10*3/uL (ref 4.0–10.5)
nRBC: 0 % (ref 0.0–0.2)

## 2020-09-10 LAB — URINALYSIS, ROUTINE W REFLEX MICROSCOPIC
Bilirubin Urine: NEGATIVE
Glucose, UA: NEGATIVE mg/dL
Hgb urine dipstick: NEGATIVE
Ketones, ur: NEGATIVE mg/dL
Leukocytes,Ua: NEGATIVE
Nitrite: NEGATIVE
Protein, ur: NEGATIVE mg/dL
Specific Gravity, Urine: 1.02 (ref 1.005–1.030)
pH: 8.5 — ABNORMAL HIGH (ref 5.0–8.0)

## 2020-09-10 LAB — LIPASE, BLOOD: Lipase: 31 U/L (ref 11–51)

## 2020-09-10 MED ORDER — ONDANSETRON HCL 4 MG/2ML IJ SOLN
4.0000 mg | Freq: Once | INTRAMUSCULAR | Status: AC
  Administered 2020-09-10: 4 mg via INTRAVENOUS
  Filled 2020-09-10: qty 2

## 2020-09-10 MED ORDER — SUCRALFATE 1 G PO TABS: 1.0000 g | ORAL_TABLET | Freq: Three times a day (TID) | ORAL | 0 refills | Status: DC

## 2020-09-10 MED ORDER — LIDOCAINE VISCOUS HCL 2 % MT SOLN
15.0000 mL | Freq: Once | OROMUCOSAL | Status: AC
  Administered 2020-09-10: 15 mL via ORAL
  Filled 2020-09-10: qty 15

## 2020-09-10 MED ORDER — ALUM & MAG HYDROXIDE-SIMETH 200-200-20 MG/5ML PO SUSP
30.0000 mL | Freq: Once | ORAL | Status: AC
  Administered 2020-09-10: 30 mL via ORAL
  Filled 2020-09-10: qty 30

## 2020-09-10 MED ORDER — FENTANYL CITRATE (PF) 100 MCG/2ML IJ SOLN
50.0000 ug | Freq: Once | INTRAMUSCULAR | Status: AC
  Administered 2020-09-10: 50 ug via INTRAVENOUS
  Filled 2020-09-10: qty 2

## 2020-09-10 MED ORDER — HYDROMORPHONE HCL 1 MG/ML IJ SOLN
1.0000 mg | Freq: Once | INTRAMUSCULAR | Status: AC
  Administered 2020-09-10: 1 mg via INTRAVENOUS
  Filled 2020-09-10: qty 1

## 2020-09-10 MED ORDER — IOHEXOL 300 MG/ML  SOLN
100.0000 mL | Freq: Once | INTRAMUSCULAR | Status: AC | PRN
  Administered 2020-09-10: 100 mL via INTRAVENOUS

## 2020-09-10 MED ORDER — SODIUM CHLORIDE 0.9 % IV BOLUS
1000.0000 mL | Freq: Once | INTRAVENOUS | Status: AC
  Administered 2020-09-10: 1000 mL via INTRAVENOUS

## 2020-09-10 NOTE — ED Provider Notes (Signed)
[facility] EMERGENCY DEPARTMENT Provider Note   CSN: [id number] Arrival date & time: 09/09/20  2350     History Chief Complaint  Patient presents with  . Abdominal Pain    Gloria Hurst is a 60 y.o. female.  The history is provided by the patient.  Abdominal Pain Pain location:  Epigastric Pain quality: aching   Pain radiates to:  Does not radiate Pain severity:  Mild Onset quality:  Gradual Duration:  8 hours Timing:  Constant Progression:  Unchanged Chronicity:  New Context: previous surgery   Context: not alcohol use   Relieved by:  Nothing Worsened by:  Nothing Associated symptoms: diarrhea and nausea   Associated symptoms: no chest pain, no chills, no cough, no dysuria, no fever, no hematuria, no shortness of breath, no sore throat and no vomiting   Risk factors: multiple surgeries        Past Medical History:  Diagnosis Date  . Acid reflux   . Back pain   . Epilepsy (HCC)   . High cholesterol   . HTN (hypertension)   . Obesity   . Stroke [facility])     Patient Active Problem List   Diagnosis Date Noted  . Altered mental state 01/06/2018  . GERD (gastroesophageal reflux disease) 01/06/2018  . Gum disease 01/06/2018  . Shortness of breath 01/06/2018  . Sleep apnea 01/06/2018  . Left leg weakness 01/06/2018  . Epigastric pain 08/31/2015  . Cholecystitis 08/30/2015  . Duodenitis 08/30/2015  . Essential hypertension 08/30/2015  . Hematochezia 08/30/2015  . Hyperlipidemia 08/30/2015  . Seizures (HCC) 08/30/2015  . Bilateral carpal tunnel syndrome 03/24/2014  . Low back pain 02/04/2014  . Lumbar radiculopathy 02/04/2014  . Lumbar spondylosis 02/04/2014  . Right knee pain 02/04/2014    Past Surgical History:  Procedure Laterality Date  . APPENDECTOMY    . BREAST REDUCTION SURGERY    . CARPAL TUNNEL RELEASE    . CESAREAN SECTION       OB History   No obstetric history on file.     Family History  Problem Relation Age of Onset  .  CAD Mother 47       Died at 82 of MI  . CVA Father 7  . Breast cancer Sister   . Bladder Cancer Maternal Aunt        Died of bladder cancer    Social History   Tobacco Use  . Smoking status: Current Every Day Smoker    Packs/day: 0.30    Years: 37.00    Pack years: 11.10    Types: Cigarettes  . Smokeless tobacco: Never Used  Vaping Use  . Vaping Use: Never used  Substance Use Topics  . Alcohol use: Not Currently    Comment: 2 drinks per weekend  . Drug use: No    Home Medications Prior to Admission medications   Medication Sig Start Date End Date Taking? Authorizing Provider  sucralfate (CARAFATE) 1 g tablet Take 1 tablet (1 g total) by mouth 4 (four) times daily -  with meals and at bedtime for 14 days. 09/10/20 09/24/20 Yes Lofton Leon, DO  albuterol (PROVENTIL HFA) 108 (90 Base) MCG/ACT inhaler Inhale 2 puffs into the lungs every 6 (six) hours as needed for wheezing or shortness of breath.     [provider]  amLODipine (NORVASC) 5 MG tablet TK 1 T PO D 05/04/19   [provider]  aspirin 81 MG chewable tablet Chew by mouth.  [provider]  aspirin EC 81 MG EC tablet Take 1 tablet (81 mg total) by mouth daily. 01/08/18   Leroy Sea, MD  budesonide-formoterol (SYMBICORT) 80-4.5 MCG/ACT inhaler Inhale 2 puffs into the lungs 2 (two) times daily.    [provider]  cholecalciferol (VITAMIN D) 400 units TABS tablet Take 400 Units by mouth daily.    [provider]  dexlansoprazole (DEXILANT) 60 MG capsule Take 60 mg by mouth daily.    [provider]  diphenhydrAMINE-Zinc Acetate (BENADRYL EX) Apply 1 application topically daily as needed (itching).    [provider]  etodolac (LODINE) 400 MG tablet Take 400 mg by mouth 2 (two) times daily as needed (joint pain/stiffness).     [provider]  etodolac (LODINE) 400 MG tablet Take by mouth. 11/07/17   [provider]  famotidine (PEPCID)  20 MG tablet Take 1 tablet (20 mg total) by mouth 2 (two) times daily. 03/03/20   Pricilla Loveless, MD  irbesartan-hydrochlorothiazide (AVALIDE) 300-12.5 MG tablet Take 1 tablet by mouth daily.    [provider]  metoprolol succinate (TOPROL-XL) 25 MG 24 hr tablet Take 25 mg by mouth daily.  12/02/17   [provider]  montelukast (SINGULAIR) 10 MG tablet Take 10 mg by mouth at bedtime.    [provider]  Multiple Vitamin (MULTIVITAMIN WITH MINERALS) TABS tablet Take 1 tablet by mouth daily. Centrum    [provider]  ondansetron (ZOFRAN ODT) 4 MG disintegrating tablet Take 1 tablet (4 mg total) by mouth every 8 (eight) hours as needed for nausea or vomiting. 03/03/20   Pricilla Loveless, MD  Oxycodone HCl 10 MG TABS Take 10 mg by mouth 4 (four) times daily as needed (pain).  12/15/17   [provider]  phenytoin (DILANTIN) 100 MG ER capsule Take 400 mg by mouth daily.    [provider]  PRESCRIPTION MEDICATION Inhale into the lungs at bedtime. CPAP    [provider]  rosuvastatin (CRESTOR) 20 MG tablet Take 1 tablet (20 mg total) by mouth daily. 01/07/18   Leroy Sea, MD    Allergies    Bee venom, Strawberry extract, and Penicillins  Review of Systems   Review of Systems  Constitutional: Negative for chills and fever.  HENT: Negative for ear pain and sore throat.   Eyes: Negative for pain and visual disturbance.  Respiratory: Negative for cough and shortness of breath.   Cardiovascular: Negative for chest pain and palpitations.  Gastrointestinal: Positive for abdominal pain, diarrhea and nausea. Negative for vomiting.  Genitourinary: Negative for dysuria and hematuria.  Musculoskeletal: Negative for arthralgias and back pain.  Skin: Negative for color change and rash.  Neurological: Negative for seizures and syncope.  All other systems reviewed and are negative.   Physical Exam Updated Vital Signs BP (!) 146/68   Pulse  63   Temp 98.3 F (36.8 C) (Oral)   Resp 20   Ht 5\' 2"  (1.575 m)   Wt 95.7 kg   LMP 03/26/2012   SpO2 97%   BMI 38.59 kg/m   Physical Exam Vitals and nursing note reviewed.  Constitutional:      General: She is not in acute distress.    Appearance: She is well-developed. She is not ill-appearing.  HENT:     Head: Normocephalic and atraumatic.     Mouth/Throat:     Mouth: Mucous membranes are moist.  Eyes:     Extraocular Movements: Extraocular movements  intact.     Conjunctiva/sclera: Conjunctivae normal.  Cardiovascular:     Rate and Rhythm: Normal rate and regular rhythm.     Heart sounds: Normal heart sounds. No murmur heard.   Pulmonary:     Effort: Pulmonary effort is normal. No respiratory distress.     Breath sounds: Normal breath sounds.  Abdominal:     General: There is distension.     Palpations: Abdomen is soft.     Tenderness: There is abdominal tenderness in the epigastric area. There is guarding.     Hernia: No hernia is present.  Musculoskeletal:     Cervical back: Neck supple.  Skin:    General: Skin is warm and dry.     Capillary Refill: Capillary refill takes less than 2 seconds.  Neurological:     General: No focal deficit present.     Mental Status: She is alert.     ED Results / Procedures / Treatments   Labs (all labs ordered are listed, but only abnormal results are displayed) Labs Reviewed  COMPREHENSIVE METABOLIC PANEL - Abnormal; Notable for the following components:      Result Value   Glucose, Bld 127 (*)    All other components within normal limits  URINALYSIS, ROUTINE W REFLEX MICROSCOPIC - Abnormal; Notable for the following components:   APPearance HAZY (*)    pH 8.5 (*)    All other components within normal limits  CBC WITH DIFFERENTIAL/PLATELET  LIPASE, BLOOD    EKG EKG Interpretation  Date/Time:  Saturday September 10 2020 02:58:32 EST Ventricular Rate:  58 PR Interval:    QRS Duration: 100 QT Interval:  472 QTC  Calculation: 464 R Axis:   59 Text Interpretation: Sinus rhythm Borderline repolarization abnormality Confirmed by Virgina Norfolk [phone]) on 09/10/2020 3:00:18 AM   Radiology CT ABDOMEN PELVIS W CONTRAST  Result Date: 09/10/2020 CLINICAL DATA:  Abdominal distension, diarrhea, nausea, difficulty breathing EXAM: CT ABDOMEN AND PELVIS WITH CONTRAST TECHNIQUE: Multidetector CT imaging of the abdomen and pelvis was performed using the standard protocol following bolus administration of intravenous contrast. CONTRAST:  OMNIPAQUE IOHEXOL 300 MG/ML  SOLN COMPARISON:  03/03/2020 FINDINGS: Lower chest: No acute pleural or parenchymal lung disease. Minimal hypoventilatory changes at the right lung base. Small hiatal hernia. Hepatobiliary: Mild diffuse hepatic steatosis unchanged. No biliary dilation. The gallbladder is surgically absent. Pancreas: Unremarkable. No pancreatic ductal dilatation or surrounding inflammatory changes. Spleen: Normal in size without focal abnormality. Adrenals/Urinary Tract: Adrenal glands are unremarkable. Kidneys are normal, without renal calculi, focal lesion, or hydronephrosis. Bladder is unremarkable. Stomach/Bowel: No bowel obstruction or ileus. No bowel wall thickening or inflammatory change. The appendix is surgically absent. Vascular/Lymphatic: No significant vascular findings are present. No enlarged abdominal or pelvic lymph nodes. Reproductive: Uterus and bilateral adnexa are unremarkable. Other: No free fluid or free gas.  No abdominal wall hernia. Musculoskeletal: No acute or destructive bony lesions. Reconstructed images demonstrate no additional findings. IMPRESSION: 1. No acute intra-abdominal or intrapelvic process. 2. Mild diffuse hepatic steatosis. 3. Small hiatal hernia. Electronically Signed   By: Sharlet Salina M.D.   On: 09/10/2020 02:33    Procedures Procedures   Medications Ordered in ED Medications  sodium chloride 0.9 % bolus 1,000 mL (1,000 mLs  Intravenous New Bag/Given 09/10/20 0113)  fentaNYL (SUBLIMAZE) injection 50 mcg (50 mcg Intravenous Given 09/10/20 0112)  ondansetron (ZOFRAN) injection 4 mg (4 mg Intravenous Given 09/10/20 0110)  iohexol (OMNIPAQUE) 300 MG/ML solution 100 mL (100 mLs Intravenous  Contrast Given 09/10/20 0212)  HYDROmorphone (DILAUDID) injection 1 mg (1 mg Intravenous Given 09/10/20 0232)  alum & mag hydroxide-simeth (MAALOX/MYLANTA) 200-200-20 MG/5ML suspension 30 mL (30 mLs Oral Given 09/10/20 0247)    And  lidocaine (XYLOCAINE) 2 % viscous mouth solution 15 mL (15 mLs Oral Given 09/10/20 0247)    ED Course  I have reviewed the triage vital signs and the nursing notes.  Pertinent labs & imaging results that were available during my care of the patient were reviewed by me and considered in my medical decision making (see chart for details).    MDM Rules/Calculators/A&P                          Dwaine DeterSantita Piatek is a 60 year old female with history of acid reflux, high cholesterol, prior appendectomy who presents the ED with abdominal pain mostly in the epigastric region.  Overall unremarkable vitals.  Pain ongoing for the last 8 hours.  Has had some nausea but no vomiting or diarrhea.  Pain mostly in the epigastric region.  Concern for pancreatitis versus bowel obstruction versus gastritis versus cholecystitis.  We will get basic labs including CT scan abdomen pelvis.  Will give IV fluid bolus, IV fentanyl, IV Zofran.  Denies any chest pain, shortness of breath.  Doubt ACS or PE.  Lab work shows no significant anemia, electrolyte abnormality, kidney injury.  Liver enzymes normal.  Gallbladder enzymes normal.  Lipase normal.  CT scan negative for pancreatitis.  Her gallbladder is actually already removed which she forgot to tell me about.  There is no bowel obstruction.  She did have a small hiatal hernia.  Felt better after IV fluids, IV Zofran, GI cocktail.  Overall suspect gastritis/reflux.No concern for cardiac or  pulmonary process given location of her pain.  She is already on a PPI.  We will add Carafate.  We will have her follow-up with her GI doctor.  Understands food changes/reflux diet.  This chart was dictated using voice recognition software.  Despite best efforts to proofread,  errors can occur which can change the documentation meaning.    Final Clinical Impression(s) / ED Diagnoses Final diagnoses:  Epigastric pain    Rx / DC Orders ED Discharge Orders         Ordered    sucralfate (CARAFATE) 1 g tablet  3 times daily with meals & bedtime        09/10/20 0257           Virgina Norfolkuratolo, Luccia Reinheimer, DO 09/10/20 0304

## 2020-09-10 NOTE — ED Triage Notes (Signed)
Dc instruction and rx reviewed.  

## 2021-05-20 ENCOUNTER — Emergency Department (HOSPITAL_BASED_OUTPATIENT_CLINIC_OR_DEPARTMENT_OTHER): Payer: Medicare Other

## 2021-05-20 ENCOUNTER — Emergency Department (HOSPITAL_BASED_OUTPATIENT_CLINIC_OR_DEPARTMENT_OTHER)
Admission: EM | Admit: 2021-05-20 | Discharge: 2021-05-21 | Disposition: A | Discharge status: Home / Self Care | Payer: Medicare Other | Attending: Emergency Medicine | Admitting: Emergency Medicine

## 2021-05-20 ENCOUNTER — Encounter (HOSPITAL_BASED_OUTPATIENT_CLINIC_OR_DEPARTMENT_OTHER): Payer: Self-pay | Admitting: Urology

## 2021-05-20 DIAGNOSIS — R110 Nausea: Secondary | ICD-10-CM | POA: Diagnosis not present

## 2021-05-20 DIAGNOSIS — K219 Gastro-esophageal reflux disease without esophagitis: Secondary | ICD-10-CM | POA: Insufficient documentation

## 2021-05-20 DIAGNOSIS — I10 Essential (primary) hypertension: Secondary | ICD-10-CM | POA: Diagnosis not present

## 2021-05-20 DIAGNOSIS — R569 Unspecified convulsions: Secondary | ICD-10-CM | POA: Diagnosis present

## 2021-05-20 DIAGNOSIS — G40909 Epilepsy, unspecified, not intractable, without status epilepticus: Secondary | ICD-10-CM | POA: Diagnosis not present

## 2021-05-20 DIAGNOSIS — Z7982 Long term (current) use of aspirin: Secondary | ICD-10-CM | POA: Insufficient documentation

## 2021-05-20 DIAGNOSIS — Y908 Blood alcohol level of 240 mg/100 ml or more: Secondary | ICD-10-CM | POA: Diagnosis not present

## 2021-05-20 DIAGNOSIS — F17210 Nicotine dependence, cigarettes, uncomplicated: Secondary | ICD-10-CM | POA: Diagnosis not present

## 2021-05-20 DIAGNOSIS — R1010 Upper abdominal pain, unspecified: Secondary | ICD-10-CM | POA: Insufficient documentation

## 2021-05-20 DIAGNOSIS — Z79899 Other long term (current) drug therapy: Secondary | ICD-10-CM | POA: Insufficient documentation

## 2021-05-20 LAB — COMPREHENSIVE METABOLIC PANEL
ALT: 31 U/L (ref 0–44)
AST: 25 U/L (ref 15–41)
Albumin: 4.3 g/dL (ref 3.5–5.0)
Alkaline Phosphatase: 85 U/L (ref 38–126)
Anion gap: 9 (ref 5–15)
BUN: 14 mg/dL (ref 6–20)
CO2: 27 mmol/L (ref 22–32)
Calcium: 9.2 mg/dL (ref 8.9–10.3)
Chloride: 101 mmol/L (ref 98–111)
Creatinine, Ser: 0.62 mg/dL (ref 0.44–1.00)
GFR, Estimated: >60 mL/min (ref >60)
Glucose, Bld: 129 mg/dL — ABNORMAL HIGH (ref 70–99)
Potassium: 3.5 mmol/L (ref 3.5–5.1)
Sodium: 137 mmol/L (ref 135–145)
Total Bilirubin: 0.4 mg/dL (ref 0.3–1.2)
Total Protein: 7.4 g/dL (ref 6.5–8.1)

## 2021-05-20 LAB — CBC WITH DIFFERENTIAL/PLATELET
Abs Immature Granulocytes: 0.11 10*3/uL — ABNORMAL HIGH (ref 0.00–0.07)
Basophils Absolute: 0.1 10*3/uL (ref 0.0–0.1)
Basophils Relative: 1 %
Eosinophils Absolute: 0.4 10*3/uL (ref 0.0–0.5)
Eosinophils Relative: 4 %
HCT: 42.5 % (ref 36.0–46.0)
Hemoglobin: 13.8 g/dL (ref 12.0–15.0)
Immature Granulocytes: 1 %
Lymphocytes Relative: 26 %
Lymphs Abs: 3.2 10*3/uL (ref 0.7–4.0)
MCH: 26.7 pg (ref 26.0–34.0)
MCHC: 32.5 g/dL (ref 30.0–36.0)
MCV: 82.4 fL (ref 80.0–100.0)
Monocytes Absolute: 0.8 10*3/uL (ref 0.1–1.0)
Monocytes Relative: 7 %
Neutro Abs: 7.4 10*3/uL (ref 1.7–7.7)
Neutrophils Relative %: 61 %
Platelets: 233 10*3/uL (ref 150–400)
RBC: 5.16 MIL/uL — ABNORMAL HIGH (ref 3.87–5.11)
RDW: 15.2 % (ref 11.5–15.5)
WBC: 12 10*3/uL — ABNORMAL HIGH (ref 4.0–10.5)
nRBC: 0 % (ref 0.0–0.2)

## 2021-05-20 LAB — URINALYSIS, ROUTINE W REFLEX MICROSCOPIC
Bilirubin Urine: NEGATIVE
Glucose, UA: NEGATIVE mg/dL
Hgb urine dipstick: NEGATIVE
Ketones, ur: NEGATIVE mg/dL
Leukocytes,Ua: NEGATIVE
Nitrite: NEGATIVE
Protein, ur: NEGATIVE mg/dL
Specific Gravity, Urine: 1.015 (ref 1.005–1.030)
pH: 7 (ref 5.0–8.0)

## 2021-05-20 LAB — LIPASE, BLOOD: Lipase: 49 U/L (ref 11–51)

## 2021-05-20 LAB — ETHANOL: Alcohol, Ethyl (B): 243 mg/dL — ABNORMAL HIGH (ref <10)

## 2021-05-20 MED ORDER — SODIUM CHLORIDE 0.9 % IV BOLUS
1000.0000 mL | Freq: Once | INTRAVENOUS | Status: AC
  Administered 2021-05-20: 1000 mL via INTRAVENOUS

## 2021-05-20 MED ORDER — ONDANSETRON HCL 4 MG/2ML IJ SOLN
4.0000 mg | Freq: Once | INTRAMUSCULAR | Status: AC
  Administered 2021-05-20: 4 mg via INTRAVENOUS
  Filled 2021-05-20: qty 2

## 2021-05-20 MED ORDER — SODIUM CHLORIDE 0.9 % IV BOLUS: 1000.0000 mL | Freq: Once | INTRAVENOUS | Status: DC

## 2021-05-20 NOTE — ED Triage Notes (Signed)
Per EMS seizures today, h/o epilepsy, states abd pain.  States Nausea, no vomiting.

## 2021-05-20 NOTE — ED Provider Notes (Signed)
[place] EMERGENCY DEPARTMENT Provider Note   CSN: [place]:[id number] Arrival date & time: 05/20/21  2142     History Chief Complaint  Patient presents with   Seizures   Abdominal Pain    Gloria Hurst is a 60 y.o. female.  The history is provided by the patient.  Seizures Seizure activity on arrival: no   Initial focality:  None Episode characteristics: abnormal movements   Postictal symptoms: no confusion   Return to baseline: yes   Severity:  Moderate Timing:  Clustered Context comment:  States having drinks with family tonight and then supposedly had some seizures. On dilantin. States compliant. Abdominal pain and nausea in the upper abdomen too     Past Medical History:  Diagnosis Date   Acid reflux    Back pain    Epilepsy ([place])    High cholesterol    HTN (hypertension)    Obesity    Stroke [facility])     Patient Active Problem List   Diagnosis Date Noted   Altered mental state 01/06/2018   GERD (gastroesophageal reflux disease) 01/06/2018   Gum disease 01/06/2018   Shortness of breath 01/06/2018   Sleep apnea 01/06/2018   Left leg weakness 01/06/2018   Epigastric pain 08/31/2015   Cholecystitis 08/30/2015   Duodenitis 08/30/2015   Essential hypertension 08/30/2015   Hematochezia 08/30/2015   Hyperlipidemia 08/30/2015   Seizures ([place]) 08/30/2015   Bilateral carpal tunnel syndrome 03/24/2014   Low back pain 02/04/2014   Lumbar radiculopathy 02/04/2014   Lumbar spondylosis 02/04/2014   Right knee pain 02/04/2014    Past Surgical History:  Procedure Laterality Date   APPENDECTOMY     BREAST REDUCTION SURGERY     CARPAL TUNNEL RELEASE     CESAREAN SECTION       OB History   No obstetric history on file.     Family History  Problem Relation Age of Onset   CAD Mother 99       Died at 16 of MI   CVA Father 28   Breast cancer Sister    Bladder Cancer Maternal Aunt        Died of bladder cancer    Social History   Tobacco Use    Smoking status: Every Day    Packs/day: 0.30    Years: 37.00    Pack years: 11.10    Types: Cigarettes   Smokeless tobacco: Never  Vaping Use   Vaping Use: Never used  Substance Use Topics   Alcohol use: Not Currently    Comment: 2 drinks per weekend   Drug use: No    Home Medications Prior to Admission medications   Medication Sig Start Date End Date Taking? Authorizing Provider  albuterol (PROVENTIL HFA) 108 (90 Base) MCG/ACT inhaler Inhale 2 puffs into the lungs every 6 (six) hours as needed for wheezing or shortness of breath.     [provider]  amLODipine (NORVASC) 5 MG tablet TK 1 T PO D 05/04/19   [provider]  aspirin 81 MG chewable tablet Chew by mouth.    [provider]  aspirin EC 81 MG EC tablet Take 1 tablet (81 mg total) by mouth daily. 01/08/18   Thurnell Lose, MD  budesonide-formoterol (SYMBICORT) 80-4.5 MCG/ACT inhaler Inhale 2 puffs into the lungs 2 (two) times daily.    [provider]  cholecalciferol (VITAMIN D) 400 units TABS tablet Take 400 Units by mouth daily.    [provider]  dexlansoprazole (DEXILANT) 60 MG capsule Take 60 mg by mouth daily.    [provider]  diphenhydrAMINE-Zinc Acetate (BENADRYL EX) Apply 1 application topically daily as needed (itching).    [provider]  etodolac (LODINE) 400 MG tablet Take 400 mg by mouth 2 (two) times daily as needed (joint pain/stiffness).     [provider]  etodolac (LODINE) 400 MG tablet Take by mouth. 11/07/17   [provider]  famotidine (PEPCID) 20 MG tablet Take 1 tablet (20 mg total) by mouth 2 (two) times daily. 03/03/20   Sherwood Gambler, MD  irbesartan-hydrochlorothiazide (AVALIDE) 300-12.5 MG tablet Take 1 tablet by mouth daily.    [provider]  metoprolol succinate (TOPROL-XL) 25 MG 24 hr tablet Take 25 mg by mouth daily.  12/02/17   [provider]  montelukast (SINGULAIR) 10 MG tablet Take 10  mg by mouth at bedtime.    [provider]  Multiple Vitamin (MULTIVITAMIN WITH MINERALS) TABS tablet Take 1 tablet by mouth daily. Centrum    [provider]  ondansetron (ZOFRAN ODT) 4 MG disintegrating tablet Take 1 tablet (4 mg total) by mouth every 8 (eight) hours as needed for nausea or vomiting. 03/03/20   Sherwood Gambler, MD  Oxycodone HCl 10 MG TABS Take 10 mg by mouth 4 (four) times daily as needed (pain).  12/15/17   [provider]  phenytoin (DILANTIN) 100 MG ER capsule Take 400 mg by mouth daily.    [provider]  PRESCRIPTION MEDICATION Inhale into the lungs at bedtime. CPAP    [provider]  rosuvastatin (CRESTOR) 20 MG tablet Take 1 tablet (20 mg total) by mouth daily. 01/07/18   Thurnell Lose, MD  sucralfate (CARAFATE) 1 g tablet Take 1 tablet (1 g total) by mouth 4 (four) times daily -  with meals and at bedtime for 14 days. 09/10/20 09/24/20  Lennice Sites, DO    Allergies    Bee venom, Strawberry extract, and Penicillins  Review of Systems   Review of Systems  Constitutional:  Negative for chills and fever.  HENT:  Negative for ear pain and sore throat.   Eyes:  Negative for pain and visual disturbance.  Respiratory:  Negative for cough and shortness of breath.   Cardiovascular:  Negative for chest pain and palpitations.  Gastrointestinal:  Positive for abdominal pain. Negative for vomiting.  Genitourinary:  Negative for dysuria and hematuria.  Musculoskeletal:  Negative for arthralgias and back pain.  Skin:  Negative for color change and rash.  Neurological:  Positive for seizures. Negative for syncope.  All other systems reviewed and are negative.  Physical Exam Updated Vital Signs  ED Triage Vitals  Enc Vitals Group     BP 05/20/21 2151 (!) 157/83     Pulse Rate 05/20/21 2151 69     Resp 05/20/21 2151 18     Temp 05/20/21 2151 97.6 F (36.4 C)     Temp Source 05/20/21 2151 Oral     SpO2 05/20/21 2151 97 %      Weight 05/20/21 2149 210 lb 15.7 oz (95.7 kg)     Height 05/20/21 2149 5\' 2"  (1.575 m)     Head Circumference --      Peak Flow --      Pain Score 05/20/21 2149 8     Pain Loc --      Pain Edu? --      Excl. in [place]? --  Physical Exam Vitals and nursing note reviewed.  Constitutional:      General: She is not in acute distress.    Appearance: She is well-developed. She is not ill-appearing.  HENT:     Head: Normocephalic and atraumatic.  Eyes:     Extraocular Movements: Extraocular movements intact.     Conjunctiva/sclera: Conjunctivae normal.     Pupils: Pupils are equal, round, and reactive to light.  Cardiovascular:     Rate and Rhythm: Normal rate and regular rhythm.     Heart sounds: No murmur heard. Pulmonary:     Effort: Pulmonary effort is normal. No respiratory distress.     Breath sounds: Normal breath sounds.  Abdominal:     Palpations: Abdomen is soft.     Tenderness: There is abdominal tenderness.  Musculoskeletal:        General: No swelling.     Cervical back: Neck supple.  Skin:    General: Skin is warm and dry.     Capillary Refill: Capillary refill takes less than 2 seconds.  Neurological:     General: No focal deficit present.     Mental Status: She is alert and oriented to person, place, and time.     Cranial Nerves: No cranial nerve deficit.     Sensory: No sensory deficit.     Motor: No weakness.     Coordination: Coordination normal.  Psychiatric:        Mood and Affect: Mood normal.    ED Results / Procedures / Treatments   Labs (all labs ordered are listed, but only abnormal results are displayed) Labs Reviewed  CBC WITH DIFFERENTIAL/PLATELET - Abnormal; Notable for the following components:      Result Value   WBC 12.0 (*)    RBC 5.16 (*)    Abs Immature Granulocytes 0.11 (*)    All other components within normal limits  COMPREHENSIVE METABOLIC PANEL - Abnormal; Notable for the following components:   Glucose, Bld 129 (*)    All  other components within normal limits  ETHANOL - Abnormal; Notable for the following components:   Alcohol, Ethyl (B) 243 (*)    All other components within normal limits  LIPASE, BLOOD  PHENYTOIN LEVEL, FREE AND TOTAL  URINALYSIS, ROUTINE W REFLEX MICROSCOPIC    EKG EKG shows sinus rhythm.  Normal intervals.  No ischemic changes.  Radiology No results found.  Procedures Procedures   Medications Ordered in ED Medications  ondansetron (ZOFRAN) injection 4 mg (4 mg Intravenous Given 05/20/21 2200)  sodium chloride 0.9 % bolus 1,000 mL (1,000 mLs Intravenous New Bag/Given 05/20/21 2200)    ED Course  I have reviewed the triage vital signs and the nursing notes.  Pertinent labs & imaging results that were available during my care of the patient were reviewed by me and considered in my medical decision making (see chart for details).    MDM Rules/Calculators/A&P                           Gloria Hurst is here after possible seizures.  Also with abdominal pain.  Drinking alcohol tonight and then may be had seizure episode.  Is on Dilantin but may be some noncompliance.  Overall she appears neurologically intact.  She is complaining of some upper abdominal pain which she has a history of as well.  She does not know if she hit her head she does not remember anything that happened tonight.  We will get a scan of her head neck and abdomen and pelvis.  We will give her fluid hydration, check basic labs.  Suspect breakthrough seizure in the setting of alcohol use.  She is also very tearful on exam and may be these were pseudoseizures as well.  We will continue to monitor for any seizure activity here.  Supposedly her brother may have recently died as well.  We will try to talk with family.  Family states that they think stress might be playing a role as well.  She did have multiple alcoholic drinks tonight.  Patient did have oxygen desaturation while she was sleeping.  Placed on 2 L of  oxygen.  She does wear CPAP at night.  Alcohol very elevated at 243.  Otherwise lab work is unremarkable.  CT imaging pending as well as chest x-ray and right shoulder x-ray as she is complaining of right shoulder pain.  Will handed off to oncoming ED staff with patient pending metabolization of alcohol.  Family is here and if work-up is unremarkable suspect that she would be okay for discharge with family for further care.  Recommend follow-up with neurologist and family is aware of this.  Phenytoin level has been sent.  This chart was dictated using voice recognition software.  Despite best efforts to proofread,  errors can occur which can change the documentation meaning.   Final Clinical Impression(s) / ED Diagnoses Final diagnoses:  Seizure-like activity ([place])  Blood alcohol level of 240 mg/100 ml or more    Rx / DC Orders ED Discharge Orders     None        Lennice Sites, DO 05/20/21 2240

## 2021-05-20 NOTE — ED Notes (Signed)
Pt reports 3 shots and 1 beer consumed tonight before seizures started. Unkown if hit head

## 2021-05-20 NOTE — ED Triage Notes (Signed)
Per [facility] pt's sister said she had 4 seizures prior to their arrival, EMS witnessed one "very brief" seizure with "full body shakes". Pt has hx of seizures and is on Dilantin.

## 2021-05-20 NOTE — ED Notes (Signed)
Pt sleeping and O2 saturation dropped to 88%. Placed pt on 2L [place]. O2 saturation returned to 98%. MD aware. Pt's daughter states that she has a hx of sleep apnea and wears a cpap at home.

## 2021-05-20 NOTE — ED Provider Notes (Signed)
Blood pressure (!) 144/86, pulse 68, temperature 97.6 F (36.4 C), temperature source Oral, resp. rate 19, height 5\' 2"  (1.575 m), weight 95.7 kg, last menstrual period 03/26/2012, SpO2 98 %.  Assuming care from Dr. 03/28/2012.  In short, Gloria Hurst is a 60 y.o. female with a chief complaint of Seizures and Abdominal Pain .  Refer to the original H&P for additional details.  The current plan of care is to follow up on labs and imaging.  12:17 AM  Patient's lab work resulting with no acute abnormalities.  She will resume taking her Dilantin, which she missed today.  She prefers to take a dose when she gets home.  She had a brief episode of hypoxemia with some known history of OSA.  He has CPAP which she uses at night and plans to use this at home as well.  I have referred her to [facility] neurology for follow-up, as she is not currently following with a neurologist.  CT imaging of the head, cervical spine, CT abdomen/pelvis reviewed with no acute findings.     [facility], MD 05/21/21 [phone]

## 2021-05-21 NOTE — Discharge Instructions (Signed)

## 2021-05-23 LAB — PHENYTOIN LEVEL, FREE AND TOTAL
Phenytoin, Free: NOT DETECTED ug/mL (ref 1.0–2.0)
Phenytoin, Total: <0.8 ug/mL — ABNORMAL LOW (ref 10.0–20.0)

## 2021-09-11 ENCOUNTER — Other Ambulatory Visit: Payer: Self-pay

## 2021-09-11 ENCOUNTER — Encounter (HOSPITAL_COMMUNITY): Payer: Self-pay

## 2021-09-11 ENCOUNTER — Emergency Department (HOSPITAL_COMMUNITY): Payer: Medicare Other

## 2021-09-11 ENCOUNTER — Emergency Department (HOSPITAL_COMMUNITY)
Admission: EM | Admit: 2021-09-11 | Discharge: 2021-09-12 | Disposition: A | Discharge status: Home / Self Care | Payer: Medicare Other | Attending: Emergency Medicine | Admitting: Emergency Medicine

## 2021-09-11 DIAGNOSIS — E1165 Type 2 diabetes mellitus with hyperglycemia: Secondary | ICD-10-CM | POA: Insufficient documentation

## 2021-09-11 DIAGNOSIS — E876 Hypokalemia: Secondary | ICD-10-CM | POA: Insufficient documentation

## 2021-09-11 DIAGNOSIS — I10 Essential (primary) hypertension: Secondary | ICD-10-CM | POA: Insufficient documentation

## 2021-09-11 DIAGNOSIS — Z79899 Other long term (current) drug therapy: Secondary | ICD-10-CM | POA: Insufficient documentation

## 2021-09-11 DIAGNOSIS — R109 Unspecified abdominal pain: Secondary | ICD-10-CM | POA: Diagnosis not present

## 2021-09-11 DIAGNOSIS — R0789 Other chest pain: Secondary | ICD-10-CM | POA: Insufficient documentation

## 2021-09-11 DIAGNOSIS — Z7982 Long term (current) use of aspirin: Secondary | ICD-10-CM | POA: Diagnosis not present

## 2021-09-11 LAB — CBC
HCT: 38.6 % (ref 36.0–46.0)
Hemoglobin: 12.6 g/dL (ref 12.0–15.0)
MCH: 26.8 pg (ref 26.0–34.0)
MCHC: 32.6 g/dL (ref 30.0–36.0)
MCV: 82 fL (ref 80.0–100.0)
Platelets: 220 10*3/uL (ref 150–400)
RBC: 4.71 MIL/uL (ref 3.87–5.11)
RDW: 15.5 % (ref 11.5–15.5)
WBC: 8.2 10*3/uL (ref 4.0–10.5)
nRBC: 0 % (ref 0.0–0.2)

## 2021-09-11 LAB — BASIC METABOLIC PANEL
Anion gap: 12 (ref 5–15)
BUN: 11 mg/dL (ref 6–20)
CO2: 24 mmol/L (ref 22–32)
Calcium: 8.9 mg/dL (ref 8.9–10.3)
Chloride: 102 mmol/L (ref 98–111)
Creatinine, Ser: 0.67 mg/dL (ref 0.44–1.00)
GFR, Estimated: >60 mL/min (ref >60)
Glucose, Bld: 123 mg/dL — ABNORMAL HIGH (ref 70–99)
Potassium: 3.2 mmol/L — ABNORMAL LOW (ref 3.5–5.1)
Sodium: 138 mmol/L (ref 135–145)

## 2021-09-11 LAB — TROPONIN I (HIGH SENSITIVITY): Troponin I (High Sensitivity): 8 ng/L (ref <18)

## 2021-09-11 MED ORDER — HYDROMORPHONE HCL 1 MG/ML IJ SOLN
0.5000 mg | Freq: Once | INTRAMUSCULAR | Status: AC
  Administered 2021-09-11: 0.5 mg via INTRAVENOUS
  Filled 2021-09-11: qty 1

## 2021-09-11 MED ORDER — ALUM & MAG HYDROXIDE-SIMETH 200-200-20 MG/5ML PO SUSP
30.0000 mL | Freq: Once | ORAL | Status: AC
  Administered 2021-09-11: 30 mL via ORAL
  Filled 2021-09-11: qty 30

## 2021-09-11 NOTE — ED Triage Notes (Signed)
Pt c/o central/left sided chest pain that radiates to her left shoulder and generalized weakness that started tonight around 21:00. Pt reports pain feels like a "stabbing". ?

## 2021-09-11 NOTE — ED Notes (Addendum)
50 mLs of urine output.  ?

## 2021-09-12 DIAGNOSIS — R0789 Other chest pain: Secondary | ICD-10-CM | POA: Diagnosis not present

## 2021-09-12 LAB — TROPONIN I (HIGH SENSITIVITY): Troponin I (High Sensitivity): 8 ng/L (ref <18)

## 2021-09-12 MED ORDER — AMLODIPINE BESYLATE 5 MG PO TABS
5.0000 mg | ORAL_TABLET | Freq: Once | ORAL | Status: AC
  Administered 2021-09-12: 5 mg via ORAL
  Filled 2021-09-12: qty 1

## 2021-09-12 MED ORDER — OMEPRAZOLE 20 MG PO CPDR: 20.0000 mg | DELAYED_RELEASE_CAPSULE | Freq: Every day | ORAL | 0 refills | Status: DC

## 2021-09-12 NOTE — ED Provider Notes (Signed)
?[facility] ?Provider Note ? ? ?CSN: [id number] ?Arrival date & time: 09/11/21  2235 ? ?  ? ?History ? ?Chief Complaint  ?Patient presents with  ? Chest Pain  ? ? ?Gloria Hurst is a 61 y.o. female. ? ?HPI ? ?Patient with medical history including diabetes, hypertension, hyperlipidemia, nicotine user presents with complaints of left chest pain.  Patient started at 9 PM today, states it happened after she was eating food, she was sitting in the chair and started have this pain, describes it as a sharp pressure-like sensation, states it remained mainly in her left chest, she had no associated shortness of breath,, diaphoretic, nausea vomiting, denies pleuritic chest pain, she does notice she felt slightly lightheaded but this was  positional.  She has no cardiac history, no history of PEs or DVTs currently not on hormone therapy, she does have a family history of cardiac abnormalities, states that she [facility] had a echo 3 years ago which was unremarkable as well as a nuclear stress test 1 year ago which was also unremarkable.  She has history of acid reflux, she states movements, and position does not affect her pain.  She does not endorse any other complaints at this time. ? ?Patient's daughter is at bedside able to validate the story. ? ?Home Medications ?Prior to Admission medications   ?Medication Sig Start Date End Date Taking? Authorizing Provider  ?omeprazole (PRILOSEC) 20 MG capsule Take 1 capsule (20 mg total) by mouth daily. 09/12/21 10/12/21 Yes Carroll SageFaulkner, Folashade Gamboa J, PA-C  ?albuterol (PROVENTIL HFA) 108 (90 Base) MCG/ACT inhaler Inhale 2 puffs into the lungs every 6 (six) hours as needed for wheezing or shortness of breath.     [provider]  ?amLODipine (NORVASC) 5 MG tablet TK 1 T PO D 05/04/19   [provider]  ?aspirin 81 MG chewable tablet Chew by mouth.    [provider]  ?aspirin EC 81 MG EC tablet Take  1 tablet (81 mg total) by mouth daily. 01/08/18   Leroy SeaSingh, Prashant K, MD  ?budesonide-formoterol (SYMBICORT) 80-4.5 MCG/ACT inhaler Inhale 2 puffs into the lungs 2 (two) times daily.    [provider]  ?cholecalciferol (VITAMIN D) 400 units TABS tablet Take 400 Units by mouth daily.    [provider]  ?dexlansoprazole (DEXILANT) 60 MG capsule Take 60 mg by mouth daily.    [provider]  ?diphenhydrAMINE-Zinc Acetate (BENADRYL EX) Apply 1 application topically daily as needed (itching).    [provider]  ?etodolac (LODINE) 400 MG tablet Take 400 mg by mouth 2 (two) times daily as needed (joint pain/stiffness).     [provider]  ?etodolac (LODINE) 400 MG tablet Take by mouth. 11/07/17   [provider]  ?famotidine (PEPCID) 20 MG tablet Take 1 tablet (20 mg total) by mouth 2 (two) times daily. 03/03/20   Pricilla LovelessGoldston, Scott, MD  ?irbesartan-hydrochlorothiazide (AVALIDE) 300-12.5 MG tablet Take 1 tablet by mouth daily.    [provider]  ?metoprolol succinate (TOPROL-XL) 25 MG 24 hr tablet Take 25 mg by mouth daily.  12/02/17   [provider]  ?montelukast (SINGULAIR) 10 MG tablet Take 10 mg by mouth at bedtime.    [provider]  ?Multiple Vitamin (MULTIVITAMIN WITH MINERALS) TABS tablet Take 1 tablet by mouth daily. Centrum    [provider]  ?ondansetron (ZOFRAN ODT) 4 MG disintegrating tablet Take 1 tablet (4 mg total) by mouth every  8 (eight) hours as needed for nausea or vomiting. 03/03/20   Pricilla Loveless, MD  ?Oxycodone HCl 10 MG TABS Take 10 mg by mouth 4 (four) times daily as needed (pain).  12/15/17   [provider]  ?phenytoin (DILANTIN) 100 MG ER capsule Take 400 mg by mouth daily.    [provider]  ?PRESCRIPTION MEDICATION Inhale into the lungs at bedtime. CPAP    [provider]  ?rosuvastatin (CRESTOR) 20 MG tablet Take 1 tablet (20 mg total) by mouth daily. 01/07/18   Leroy Sea,  MD  ?sucralfate (CARAFATE) 1 g tablet Take 1 tablet (1 g total) by mouth 4 (four) times daily -  with meals and at bedtime for 14 days. 09/10/20 09/24/20  Virgina Norfolk, DO  ?   ? ?Allergies    ?Bee venom, Strawberry extract, and Penicillins   ? ?Review of Systems   ?Review of Systems  ?Constitutional:  Negative for chills and fever.  ?Respiratory:  Positive for shortness of breath.   ?Cardiovascular:  Negative for chest pain.  ?Gastrointestinal:  Negative for abdominal pain.  ?Neurological:  Negative for headaches.  ? ?Physical Exam ?Updated Vital Signs ?BP (!) 180/90   Pulse 72   Temp 97.9 ?F (36.6 ?C) (Oral)   Resp (!) 21   Ht 5\' 2"  (1.575 m)   Wt 95.3 kg   LMP 03/26/2012   SpO2 97%   BMI 38.41 kg/m?  ?Physical Exam ?Vitals and nursing note reviewed.  ?Constitutional:   ?   General: She is not in acute distress. ?   Appearance: She is not ill-appearing.  ?HENT:  ?   Head: Normocephalic and atraumatic.  ?   Nose: No congestion.  ?Eyes:  ?   Conjunctiva/sclera: Conjunctivae normal.  ?Cardiovascular:  ?   Rate and Rhythm: Normal rate and regular rhythm.  ?   Pulses: Normal pulses.  ?   Heart sounds: No murmur heard. ?  No friction rub. No gallop.  ?Pulmonary:  ?   Effort: No respiratory distress.  ?   Breath sounds: No wheezing, rhonchi or rales.  ?   Comments: Patient's chest pain was reproducible, substernal, focalized, no overlying skin changes.  No crepitus ?Chest:  ?   Chest wall: Tenderness present.  ?Abdominal:  ?   Palpations: Abdomen is soft.  ?   Tenderness: There is abdominal tenderness. There is no right CVA tenderness or left CVA tenderness.  ?   Comments: Patient is nondistended, normal bowel sounds, dull to percussion, has noted epigastric tenderness, no guarding, rebound is, peritoneal sign.  ?Musculoskeletal:  ?   Right lower leg: No edema.  ?   Left lower leg: No edema.  ?Skin: ?   General: Skin is warm and dry.  ?Neurological:  ?   Mental Status: She is alert.  ?   Comments: No facial  asymmetry, no difficult word finding, a follow two-step commands, no unilateral weakness present.  ?Psychiatric:     ?   Mood and Affect: Mood normal.  ? ? ?ED Results / Procedures / Treatments   ?Labs ?(all labs ordered are listed, but only abnormal results are displayed) ?Labs Reviewed  ?BASIC METABOLIC PANEL - Abnormal; Notable for the following components:  ?    Result Value  ? Potassium 3.2 (*)   ? Glucose, Bld 123 (*)   ? All other components within normal limits  ?CBC  ?TROPONIN I (HIGH SENSITIVITY)  ?TROPONIN I (HIGH SENSITIVITY)  ? ? ?EKG ?EKG  Interpretation ? ?Date/Time:  Monday September 11 2021 22:36:21 EDT ?Ventricular Rate:  84 ?PR Interval:  146 ?QRS Duration: 90 ?QT Interval:  366 ?QTC Calculation: 433 ?R Axis:   53 ?Text Interpretation: Sinus rhythm Borderline repolarization abnormality No significant change since 05/20/2021 Confirmed by Geoffery Lyons ([id number]) on 09/12/2021 1:49:16 AM ? ?Radiology ?DG Chest 2 View ? ?Result Date: 09/11/2021 ?CLINICAL DATA:  Chest pain. EXAM: CHEST - 2 VIEW COMPARISON:  05/19/2021. FINDINGS: The heart size and mediastinal contours are within normal limits. There is minimal atelectasis at the right lung base. No effusion or pneumothorax. No acute osseous abnormality. IMPRESSION: Minimal atelectasis at the right lung base. Electronically Signed   By: Thornell Sartorius M.D.   On: 09/11/2021 23:27   ? ?Procedures ?Procedures  ? ? ?Medications Ordered in ED ?Medications  ?alum & mag hydroxide-simeth (MAALOX/MYLANTA) 200-200-20 MG/5ML suspension 30 mL (30 mLs Oral Given 09/11/21 2354)  ?HYDROmorphone (DILAUDID) injection 0.5 mg (0.5 mg Intravenous Given 09/11/21 2356)  ?amLODipine (NORVASC) tablet 5 mg (5 mg Oral Given 09/12/21 0223)  ? ? ?ED Course/ Medical Decision Making/ A&P ?  ?                        ?Medical Decision Making ?Risk ?OTC drugs. ?Prescription drug management. ? ? ?This patient presents to the ED for concern of chest pain, this involves an extensive number of  treatment options, and is a complaint that carries with it a high risk of complications and morbidity.  The differential diagnosis includes ACS, PE, dissection, pneumonia ? ? ? ?Additional history obtained: ? ?Additional

## 2021-09-12 NOTE — Discharge Instructions (Addendum)
Lab work and imaging were all reassuring, I suspect this might be more GI related, I have given you a prescription for a acid pill please take as prescribed, you may also take Tums and/or Pepcid as this can help with your symptoms. ? ?Due to your risk factors I would like you to follow-up with your cardiologist, please follow-up within the next weeks time for reevaluation, if your symptoms worsen or come back please come to the emergency department for reevaluation. ? ?Come back to the emergency department if you develop chest pain, shortness of breath, severe abdominal pain, uncontrolled nausea, vomiting, diarrhea. ? ?

## 2022-01-18 ENCOUNTER — Emergency Department (HOSPITAL_BASED_OUTPATIENT_CLINIC_OR_DEPARTMENT_OTHER): Payer: Medicare Other

## 2022-01-18 ENCOUNTER — Inpatient Hospital Stay (HOSPITAL_BASED_OUTPATIENT_CLINIC_OR_DEPARTMENT_OTHER)
Admission: EM | Admit: 2022-01-18 | Discharge: 2022-01-26 | DRG: 103 | Disposition: A | Discharge status: Home Health | Payer: Medicare Other | Attending: Internal Medicine | Admitting: Internal Medicine

## 2022-01-18 ENCOUNTER — Other Ambulatory Visit: Payer: Self-pay

## 2022-01-18 ENCOUNTER — Encounter (HOSPITAL_BASED_OUTPATIENT_CLINIC_OR_DEPARTMENT_OTHER): Payer: Self-pay | Admitting: Emergency Medicine

## 2022-01-18 DIAGNOSIS — K219 Gastro-esophageal reflux disease without esophagitis: Secondary | ICD-10-CM | POA: Diagnosis present

## 2022-01-18 DIAGNOSIS — Z880 Allergy status to penicillin: Secondary | ICD-10-CM

## 2022-01-18 DIAGNOSIS — Z79890 Hormone replacement therapy (postmenopausal): Secondary | ICD-10-CM | POA: Diagnosis not present

## 2022-01-18 DIAGNOSIS — Z7982 Long term (current) use of aspirin: Secondary | ICD-10-CM

## 2022-01-18 DIAGNOSIS — E7800 Pure hypercholesterolemia, unspecified: Secondary | ICD-10-CM | POA: Diagnosis present

## 2022-01-18 DIAGNOSIS — M545 Low back pain, unspecified: Secondary | ICD-10-CM | POA: Diagnosis present

## 2022-01-18 DIAGNOSIS — Z91030 Bee allergy status: Secondary | ICD-10-CM

## 2022-01-18 DIAGNOSIS — Z9049 Acquired absence of other specified parts of digestive tract: Secondary | ICD-10-CM | POA: Diagnosis not present

## 2022-01-18 DIAGNOSIS — Z8249 Family history of ischemic heart disease and other diseases of the circulatory system: Secondary | ICD-10-CM | POA: Diagnosis not present

## 2022-01-18 DIAGNOSIS — G932 Benign intracranial hypertension: Secondary | ICD-10-CM | POA: Diagnosis not present

## 2022-01-18 DIAGNOSIS — Z91199 Patient's noncompliance with other medical treatment and regimen due to unspecified reason: Secondary | ICD-10-CM

## 2022-01-18 DIAGNOSIS — Z538 Procedure and treatment not carried out for other reasons: Secondary | ICD-10-CM | POA: Diagnosis not present

## 2022-01-18 DIAGNOSIS — Z8673 Personal history of transient ischemic attack (TIA), and cerebral infarction without residual deficits: Secondary | ICD-10-CM

## 2022-01-18 DIAGNOSIS — E119 Type 2 diabetes mellitus without complications: Secondary | ICD-10-CM | POA: Diagnosis not present

## 2022-01-18 DIAGNOSIS — G40909 Epilepsy, unspecified, not intractable, without status epilepticus: Secondary | ICD-10-CM | POA: Diagnosis present

## 2022-01-18 DIAGNOSIS — H532 Diplopia: Secondary | ICD-10-CM | POA: Diagnosis present

## 2022-01-18 DIAGNOSIS — Z6841 Body Mass Index (BMI) 40.0 and over, adult: Secondary | ICD-10-CM

## 2022-01-18 DIAGNOSIS — F17210 Nicotine dependence, cigarettes, uncomplicated: Secondary | ICD-10-CM | POA: Diagnosis not present

## 2022-01-18 DIAGNOSIS — I10 Essential (primary) hypertension: Secondary | ICD-10-CM | POA: Diagnosis not present

## 2022-01-18 DIAGNOSIS — J45909 Unspecified asthma, uncomplicated: Secondary | ICD-10-CM | POA: Diagnosis present

## 2022-01-18 DIAGNOSIS — Z9102 Food additives allergy status: Secondary | ICD-10-CM

## 2022-01-18 DIAGNOSIS — R55 Syncope and collapse: Secondary | ICD-10-CM | POA: Diagnosis present

## 2022-01-18 DIAGNOSIS — G4730 Sleep apnea, unspecified: Secondary | ICD-10-CM | POA: Diagnosis present

## 2022-01-18 DIAGNOSIS — Z79899 Other long term (current) drug therapy: Secondary | ICD-10-CM

## 2022-01-18 DIAGNOSIS — R42 Dizziness and giddiness: Secondary | ICD-10-CM

## 2022-01-18 DIAGNOSIS — R569 Unspecified convulsions: Secondary | ICD-10-CM

## 2022-01-18 HISTORY — DX: Type 2 diabetes mellitus without complications: E11.9

## 2022-01-18 HISTORY — DX: Sleep apnea, unspecified: G47.30

## 2022-01-18 LAB — COMPREHENSIVE METABOLIC PANEL
ALT: 21 U/L (ref 0–44)
AST: 21 U/L (ref 15–41)
Albumin: 4 g/dL (ref 3.5–5.0)
Alkaline Phosphatase: 85 U/L (ref 38–126)
Anion gap: 7 (ref 5–15)
BUN: 13 mg/dL (ref 6–20)
CO2: 27 mmol/L (ref 22–32)
Calcium: 9.4 mg/dL (ref 8.9–10.3)
Chloride: 102 mmol/L (ref 98–111)
Creatinine, Ser: 0.78 mg/dL (ref 0.44–1.00)
GFR, Estimated: >60 mL/min (ref >60)
Glucose, Bld: 123 mg/dL — ABNORMAL HIGH (ref 70–99)
Potassium: 3.6 mmol/L (ref 3.5–5.1)
Sodium: 136 mmol/L (ref 135–145)
Total Bilirubin: 0.5 mg/dL (ref 0.3–1.2)
Total Protein: 7.5 g/dL (ref 6.5–8.1)

## 2022-01-18 LAB — CBC WITH DIFFERENTIAL/PLATELET
Abs Immature Granulocytes: 0.04 10*3/uL (ref 0.00–0.07)
Basophils Absolute: 0.1 10*3/uL (ref 0.0–0.1)
Basophils Relative: 1 %
Eosinophils Absolute: 0.4 10*3/uL (ref 0.0–0.5)
Eosinophils Relative: 4 %
HCT: 41.7 % (ref 36.0–46.0)
Hemoglobin: 13.7 g/dL (ref 12.0–15.0)
Immature Granulocytes: 0 %
Lymphocytes Relative: 27 %
Lymphs Abs: 2.8 10*3/uL (ref 0.7–4.0)
MCH: 26.9 pg (ref 26.0–34.0)
MCHC: 32.9 g/dL (ref 30.0–36.0)
MCV: 81.8 fL (ref 80.0–100.0)
Monocytes Absolute: 0.8 10*3/uL (ref 0.1–1.0)
Monocytes Relative: 8 %
Neutro Abs: 6.3 10*3/uL (ref 1.7–7.7)
Neutrophils Relative %: 60 %
Platelets: 254 10*3/uL (ref 150–400)
RBC: 5.1 MIL/uL (ref 3.87–5.11)
RDW: 15.2 % (ref 11.5–15.5)
WBC: 10.4 10*3/uL (ref 4.0–10.5)
nRBC: 0 % (ref 0.0–0.2)

## 2022-01-18 LAB — URINALYSIS, ROUTINE W REFLEX MICROSCOPIC
Bilirubin Urine: NEGATIVE
Glucose, UA: NEGATIVE mg/dL
Hgb urine dipstick: NEGATIVE
Ketones, ur: NEGATIVE mg/dL
Leukocytes,Ua: NEGATIVE
Nitrite: NEGATIVE
Protein, ur: NEGATIVE mg/dL
Specific Gravity, Urine: 1.015 (ref 1.005–1.030)
pH: 6.5 (ref 5.0–8.0)

## 2022-01-18 LAB — LIPASE, BLOOD: Lipase: 44 U/L (ref 11–51)

## 2022-01-18 LAB — ETHANOL: Alcohol, Ethyl (B): <10 mg/dL (ref <10)

## 2022-01-18 LAB — TROPONIN I (HIGH SENSITIVITY): Troponin I (High Sensitivity): 4 ng/L (ref <18)

## 2022-01-18 MED ORDER — DIPHENHYDRAMINE HCL 50 MG/ML IJ SOLN
12.5000 mg | Freq: Once | INTRAMUSCULAR | Status: AC
  Administered 2022-01-18: 12.5 mg via INTRAVENOUS
  Filled 2022-01-18: qty 1

## 2022-01-18 MED ORDER — SODIUM CHLORIDE 0.9 % IV BOLUS
1000.0000 mL | Freq: Once | INTRAVENOUS | Status: AC
  Administered 2022-01-18: 1000 mL via INTRAVENOUS

## 2022-01-18 MED ORDER — PROCHLORPERAZINE EDISYLATE 10 MG/2ML IJ SOLN
10.0000 mg | Freq: Once | INTRAMUSCULAR | Status: AC
  Administered 2022-01-18: 10 mg via INTRAVENOUS
  Filled 2022-01-18: qty 2

## 2022-01-18 NOTE — ED Provider Notes (Signed)
[facility] EMERGENCY DEPARTMENT Provider Note   CSN: [id number] Arrival date & time: 01/18/22  1718     History  Chief Complaint  Patient presents with   Loss of Consciousness   Dizziness    Gloria Hurst is a 61 y.o. female.  Is here after syncopal events.  She has had 3 syncopal events in the last 3 weeks.  She states that before the episode she does not feel well and then the next thing she knows she has passed out.  She did not want to come to the hospital when it first happened back in July 4.  Happened again yesterday still was reluctant to come for evaluation.  Woke up today with a mild headache, weakness, tingling over the right side of the face.  She states that she felt like the right side of her body has been weak the last 3 days as well.  She states that she took some Aleve and that seemed to help with the headache and the numbness.  She does not have much weakness today.  She has a history of diabetes, high cholesterol, hypertension, stroke.  The history is provided by the patient.       Home Medications Prior to Admission medications   Medication Sig Start Date End Date Taking? Authorizing Provider  albuterol (PROVENTIL HFA) 108 (90 Base) MCG/ACT inhaler Inhale 2 puffs into the lungs every 6 (six) hours as needed for wheezing or shortness of breath.     [provider]  amLODipine (NORVASC) 5 MG tablet TK 1 T PO D 05/04/19   [provider]  aspirin 81 MG chewable tablet Chew by mouth.    [provider]  aspirin EC 81 MG EC tablet Take 1 tablet (81 mg total) by mouth daily. 01/08/18   Leroy Sea, MD  budesonide-formoterol (SYMBICORT) 80-4.5 MCG/ACT inhaler Inhale 2 puffs into the lungs 2 (two) times daily.    [provider]  cholecalciferol (VITAMIN D) 400 units TABS tablet Take 400 Units by mouth daily.    [provider]  dexlansoprazole (DEXILANT) 60 MG capsule Take 60 mg by mouth daily.    [provider]  diphenhydrAMINE-Zinc Acetate (BENADRYL EX) Apply 1 application topically daily as needed (itching).    [provider]  etodolac (LODINE) 400 MG tablet Take 400 mg by mouth 2 (two) times daily as needed (joint pain/stiffness).     [provider]  etodolac (LODINE) 400 MG tablet Take by mouth. 11/07/17   [provider]  famotidine (PEPCID) 20 MG tablet Take 1 tablet (20 mg total) by mouth 2 (two) times daily. 03/03/20   Pricilla Loveless, MD  irbesartan-hydrochlorothiazide (AVALIDE) 300-12.5 MG tablet Take 1 tablet by mouth daily.    [provider]  metoprolol succinate (TOPROL-XL) 25 MG 24 hr tablet Take 25 mg by mouth daily.  12/02/17   [provider]  montelukast (SINGULAIR) 10 MG tablet Take 10 mg by mouth at bedtime.    [provider]  Multiple Vitamin (MULTIVITAMIN WITH MINERALS) TABS tablet Take 1 tablet by mouth daily. Centrum    [provider]  omeprazole (PRILOSEC) 20 MG capsule Take 1 capsule (20 mg total) by mouth daily. 09/12/21 10/12/21  Carroll Sage, PA-C  ondansetron (ZOFRAN ODT) 4 MG disintegrating tablet Take 1 tablet (4 mg total) by mouth every 8 (eight) hours as needed for nausea or vomiting. 03/03/20   Pricilla Loveless, MD  Oxycodone HCl 10 MG TABS  Take 10 mg by mouth 4 (four) times daily as needed (pain).  12/15/17   [provider]  phenytoin (DILANTIN) 100 MG ER capsule Take 400 mg by mouth daily.    [provider]  PRESCRIPTION MEDICATION Inhale into the lungs at bedtime. CPAP    [provider]  rosuvastatin (CRESTOR) 20 MG tablet Take 1 tablet (20 mg total) by mouth daily. 01/07/18   Leroy Sea, MD  sucralfate (CARAFATE) 1 g tablet Take 1 tablet (1 g total) by mouth 4 (four) times daily -  with meals and at bedtime for 14 days. 09/10/20 09/24/20  Virgina Norfolk, DO      Allergies    Bee venom, Strawberry extract, and Penicillins    Review of Systems   Review of  Systems  Physical Exam Updated Vital Signs BP (!) 123/58   Pulse 63   Temp 98.7 F (37.1 C) (Oral)   Resp 19   Ht 5\' 2"  (1.575 m)   Wt 97.5 kg   LMP 03/26/2012   SpO2 98%   BMI 39.32 kg/m  Physical Exam Vitals and nursing note reviewed.  Constitutional:      General: She is not in acute distress.    Appearance: She is well-developed.  HENT:     Head: Normocephalic and atraumatic.     Nose: Nose normal.     Mouth/Throat:     Mouth: Mucous membranes are moist.  Eyes:     Extraocular Movements: Extraocular movements intact.     Conjunctiva/sclera: Conjunctivae normal.     Pupils: Pupils are equal, round, and reactive to light.  Cardiovascular:     Rate and Rhythm: Normal rate and regular rhythm.     Pulses: Normal pulses.     Heart sounds: Normal heart sounds. No murmur heard. Pulmonary:     Effort: Pulmonary effort is normal. No respiratory distress.     Breath sounds: Normal breath sounds.  Abdominal:     Palpations: Abdomen is soft.     Tenderness: There is no abdominal tenderness.  Musculoskeletal:        General: No swelling.     Cervical back: Normal range of motion and neck supple.  Skin:    General: Skin is warm and dry.     Capillary Refill: Capillary refill takes less than 2 seconds.  Neurological:     General: No focal deficit present.     Mental Status: She is alert and oriented to person, place, and time.     Cranial Nerves: No cranial nerve deficit.     Sensory: No sensory deficit.     Motor: No weakness.     Comments: 5+ out of 5 strength throughout, normal sensation, no drift, normal finger-nose-finger, normal speech  Psychiatric:        Mood and Affect: Mood normal.     ED Results / Procedures / Treatments   Labs (all labs ordered are listed, but only abnormal results are displayed) Labs Reviewed  COMPREHENSIVE METABOLIC PANEL - Abnormal; Notable for the following components:      Result Value   Glucose, Bld 123 (*)    All other components  within normal limits  CBC WITH DIFFERENTIAL/PLATELET  LIPASE, BLOOD  ETHANOL  URINALYSIS, ROUTINE W REFLEX MICROSCOPIC  TROPONIN I (HIGH SENSITIVITY)    EKG EKG Interpretation  Date/Time:  Thursday January 18 2022 17:32:23 EDT Ventricular Rate:  71 PR Interval:  136 QRS Duration: 82 QT Interval:  400 QTC Calculation: 434  R Axis:   72 Text Interpretation: Normal sinus rhythm No significant change since last tracing When compared with ECG of 11-Sep-2021 22:36, PREVIOUS ECG IS PRESENT Confirmed by Virgina Norfolk [phone]) on 01/18/2022 5:36:05 PM  Radiology CT Head Wo Contrast  Result Date: 01/18/2022 CLINICAL DATA:  Syncopal episodes, headache, dizziness EXAM: CT HEAD WITHOUT CONTRAST CT CERVICAL SPINE WITHOUT CONTRAST TECHNIQUE: Multidetector CT imaging of the head and cervical spine was performed following the standard protocol without intravenous contrast. Multiplanar CT image reconstructions of the cervical spine were also generated. RADIATION DOSE REDUCTION: This exam was performed according to the departmental dose-optimization program which includes automated exposure control, adjustment of the mA and/or kV according to patient size and/or use of iterative reconstruction technique. COMPARISON:  05/20/2021 CT head and cervical spine FINDINGS: CT HEAD FINDINGS Brain: No evidence of acute infarct, hemorrhage, mass, mass effect, or midline shift. No hydrocephalus or extra-axial fluid collection. Vascular: No hyperdense vessel. Skull: Normal. Negative for fracture or focal lesion. Sinuses/Orbits: Mucous retention cysts in the right-greater-than-left maxillary sinus. Mucosal thickening in the ethmoid air cells. No acute finding in the orbits. Other: The mastoid air cells are well aerated. CT CERVICAL SPINE FINDINGS Alignment: Reversal of the normal cervical lordosis. No traumatic listhesis. Skull base and vertebrae: No acute fracture. No primary bone lesion or focal pathologic process. Soft tissues and  spinal canal: No prevertebral fluid or swelling. No visible canal hematoma. Disc levels:  No high-grade spinal canal stenosis. Upper chest: No focal pulmonary opacity or pleural effusion in the imaged lungs. Other: None. IMPRESSION: 1.  No acute intracranial process. 2.  No acute fracture or traumatic listhesis in the cervical spine. Electronically Signed   By: Wiliam Ke M.D.   On: 01/18/2022 18:39   CT Cervical Spine Wo Contrast  Result Date: 01/18/2022 CLINICAL DATA:  Syncopal episodes, headache, dizziness EXAM: CT HEAD WITHOUT CONTRAST CT CERVICAL SPINE WITHOUT CONTRAST TECHNIQUE: Multidetector CT imaging of the head and cervical spine was performed following the standard protocol without intravenous contrast. Multiplanar CT image reconstructions of the cervical spine were also generated. RADIATION DOSE REDUCTION: This exam was performed according to the departmental dose-optimization program which includes automated exposure control, adjustment of the mA and/or kV according to patient size and/or use of iterative reconstruction technique. COMPARISON:  05/20/2021 CT head and cervical spine FINDINGS: CT HEAD FINDINGS Brain: No evidence of acute infarct, hemorrhage, mass, mass effect, or midline shift. No hydrocephalus or extra-axial fluid collection. Vascular: No hyperdense vessel. Skull: Normal. Negative for fracture or focal lesion. Sinuses/Orbits: Mucous retention cysts in the right-greater-than-left maxillary sinus. Mucosal thickening in the ethmoid air cells. No acute finding in the orbits. Other: The mastoid air cells are well aerated. CT CERVICAL SPINE FINDINGS Alignment: Reversal of the normal cervical lordosis. No traumatic listhesis. Skull base and vertebrae: No acute fracture. No primary bone lesion or focal pathologic process. Soft tissues and spinal canal: No prevertebral fluid or swelling. No visible canal hematoma. Disc levels:  No high-grade spinal canal stenosis. Upper chest: No focal  pulmonary opacity or pleural effusion in the imaged lungs. Other: None. IMPRESSION: 1.  No acute intracranial process. 2.  No acute fracture or traumatic listhesis in the cervical spine. Electronically Signed   By: Wiliam Ke M.D.   On: 01/18/2022 18:39   DG Chest Portable 1 View  Result Date: 01/18/2022 CLINICAL DATA:  Weakness EXAM: PORTABLE CHEST 1 VIEW COMPARISON:  09/11/2021 FINDINGS: The heart size and mediastinal contours are within normal limits. Both  lungs are clear. The visualized skeletal structures are unremarkable. IMPRESSION: No active disease. Electronically Signed   By: Jasmine Pang M.D.   On: 01/18/2022 17:44    Procedures Procedures    Medications Ordered in ED Medications  sodium chloride 0.9 % bolus 1,000 mL (1,000 mLs Intravenous New Bag/Given 01/18/22 1849)  prochlorperazine (COMPAZINE) injection 10 mg (10 mg Intravenous Given 01/18/22 1852)  diphenhydrAMINE (BENADRYL) injection 12.5 mg (12.5 mg Intravenous Given 01/18/22 1849)    ED Course/ Medical Decision Making/ A&P                           Medical Decision Making Amount and/or Complexity of Data Reviewed Labs: ordered. Radiology: ordered.  Risk Prescription drug management. Decision regarding hospitalization.   Kemiyah Tarazon is here after syncopal event.  Normal vitals.  No fever.  History of TIA/stroke, seizures, hypertension, high cholesterol, diabetes.  New syncopal events several weeks ago on 4 July.  Had a syncopal event yesterday.  Seems like she has had some prodromes during these events.  She states that she is felt some right-sided weakness the last several days.  [place] did not feel well and then went back to her car and states that she passed out in the car.  She is woke up this morning with some headaches, right facial numbness but not much weakness.  Denies any chest pain or shortness of breath.  Overall neurologically she appears to be intact.  Maybe some numbness  over the right side of her face.  Differential diagnosis is syncopal event possibly from a neurologic process or cardiac process/arrhythmia.  Could be a complex migraine.  I have no concern for infectious process.  Could be due to electrolyte abnormality or dehydration.  She is to get a CBC, CMP, ethanol, chest x-ray, head CT, neck CT, EKG, troponin.  Likely to admit given concern for neurocardiogenic syncope.  Per my review and interpretation of labs and imaging there is no significant anemia, electrolyte abnormality, kidney injury, leukocytosis.  Troponin is normal.  Head CT and neck CT unremarkable.  Chest x-ray without evidence of infection.  Overall she is feeling better with IV fluids and headache cocktail.  Given her multiple risk factors we will have her admitted for further syncope work-up.  This chart was dictated using voice recognition software.  Despite best efforts to proofread,  errors can occur which can change the documentation meaning.         Final Clinical Impression(s) / ED Diagnoses Final diagnoses:  Syncope, unspecified syncope type    Rx / DC Orders ED Discharge Orders     None         Virgina Norfolk, DO 01/18/22 1906

## 2022-01-18 NOTE — ED Triage Notes (Addendum)
Reports pt has been having ongoing issues with syncopal episodes since 7/4. Yesterday had syncopal episode. Today headache, dizziness, and tingling in face. Denies head trauma, blood thinners. Hx of CVA. LKW 3 days ago.

## 2022-01-18 NOTE — ED Notes (Signed)
Pt does not feel like she can do the orthostatic for the standing 3 min.

## 2022-01-18 NOTE — ED Notes (Addendum)
Patient concerned for long wait time, requesting to go home. Patient was able to be talked into staying at this time. Patient did ambulate to restroom without any issues, assisted back into bed. Patient given warm blanket and additional pillow. Bed locked in lowest position with side rails raised x2. Call bell within reach. Daughter is going home at this time to get patient clothes for admission.

## 2022-01-18 NOTE — ED Notes (Signed)
Patient's daughter at bedside. Patient sleeping comfortably, bed locked in lowest position with side rails raised x2. No distress noted. Patient's daughter given cranberry juice and crackers, denies any other needs at present. Aware still waiting for a bed to be assigned at this time.

## 2022-01-18 NOTE — ED Notes (Signed)
Pt laid flat as possible for orthostatics 19:26

## 2022-01-18 NOTE — ED Notes (Signed)
Patient transported to CT 

## 2022-01-19 ENCOUNTER — Observation Stay (HOSPITAL_COMMUNITY): Payer: Medicare Other

## 2022-01-19 DIAGNOSIS — M545 Low back pain, unspecified: Secondary | ICD-10-CM

## 2022-01-19 DIAGNOSIS — R55 Syncope and collapse: Secondary | ICD-10-CM

## 2022-01-19 DIAGNOSIS — R569 Unspecified convulsions: Secondary | ICD-10-CM | POA: Diagnosis not present

## 2022-01-19 DIAGNOSIS — G8929 Other chronic pain: Secondary | ICD-10-CM

## 2022-01-19 DIAGNOSIS — I10 Essential (primary) hypertension: Secondary | ICD-10-CM | POA: Diagnosis not present

## 2022-01-19 LAB — CBG MONITORING, ED: Glucose-Capillary: 125 mg/dL — ABNORMAL HIGH (ref 70–99)

## 2022-01-19 LAB — D-DIMER, QUANTITATIVE: D-Dimer, Quant: 0.52 ug/mL-FEU — ABNORMAL HIGH (ref 0.00–0.50)

## 2022-01-19 MED ORDER — ALBUTEROL SULFATE HFA 108 (90 BASE) MCG/ACT IN AERS: 2.0000 | INHALATION_SPRAY | Freq: Four times a day (QID) | RESPIRATORY_TRACT | Status: DC | PRN

## 2022-01-19 MED ORDER — ALBUTEROL SULFATE (2.5 MG/3ML) 0.083% IN NEBU: 2.5000 mg | INHALATION_SOLUTION | Freq: Four times a day (QID) | RESPIRATORY_TRACT | Status: DC | PRN

## 2022-01-19 MED ORDER — DILTIAZEM HCL ER BEADS 300 MG PO CP24: 300.0000 mg | ORAL_CAPSULE | Freq: Every day | ORAL | Status: DC

## 2022-01-19 MED ORDER — ASPIRIN 81 MG PO TBEC
81.0000 mg | DELAYED_RELEASE_TABLET | Freq: Every day | ORAL | Status: DC
  Administered 2022-01-19 – 2022-01-26 (×8): 81 mg via ORAL
  Filled 2022-01-19 (×8): qty 1

## 2022-01-19 MED ORDER — PNEUMOCOCCAL 20-VAL CONJ VACC 0.5 ML IM SUSY
0.5000 mL | PREFILLED_SYRINGE | INTRAMUSCULAR | Status: DC
  Filled 2022-01-19: qty 0.5

## 2022-01-19 MED ORDER — MONTELUKAST SODIUM 10 MG PO TABS
10.0000 mg | ORAL_TABLET | Freq: Every day | ORAL | Status: DC
Start: 2022-01-19 — End: 2022-01-26
  Administered 2022-01-19 – 2022-01-25 (×7): 10 mg via ORAL
  Filled 2022-01-19 (×7): qty 1

## 2022-01-19 MED ORDER — ROSUVASTATIN CALCIUM 20 MG PO TABS
20.0000 mg | ORAL_TABLET | Freq: Every day | ORAL | Status: DC
  Administered 2022-01-20 – 2022-01-26 (×7): 20 mg via ORAL
  Filled 2022-01-19 (×7): qty 1

## 2022-01-19 MED ORDER — DILTIAZEM HCL ER COATED BEADS 180 MG PO CP24
300.0000 mg | ORAL_CAPSULE | Freq: Every day | ORAL | Status: DC
  Administered 2022-01-20: 300 mg via ORAL
  Filled 2022-01-19: qty 1

## 2022-01-19 MED ORDER — TIZANIDINE HCL 4 MG PO TABS: 2.0000 mg | ORAL_TABLET | Freq: Three times a day (TID) | ORAL | Status: DC | PRN

## 2022-01-19 MED ORDER — TRIAMTERENE-HCTZ 37.5-25 MG PO TABS
1.0000 | ORAL_TABLET | Freq: Every day | ORAL | Status: DC
  Filled 2022-01-19: qty 1

## 2022-01-19 MED ORDER — ENOXAPARIN SODIUM 40 MG/0.4ML IJ SOSY
40.0000 mg | PREFILLED_SYRINGE | INTRAMUSCULAR | Status: DC
  Administered 2022-01-19 – 2022-01-22 (×4): 40 mg via SUBCUTANEOUS
  Filled 2022-01-19 (×4): qty 0.4

## 2022-01-19 MED ORDER — PHENYTOIN SODIUM EXTENDED 100 MG PO CAPS
400.0000 mg | ORAL_CAPSULE | Freq: Every day | ORAL | Status: DC
  Administered 2022-01-19 – 2022-01-25 (×7): 400 mg via ORAL
  Filled 2022-01-19 (×7): qty 4

## 2022-01-19 MED ORDER — OXYCODONE HCL 5 MG PO TABS
15.0000 mg | ORAL_TABLET | Freq: Four times a day (QID) | ORAL | Status: DC | PRN
  Administered 2022-01-19 – 2022-01-25 (×11): 15 mg via ORAL
  Filled 2022-01-19 (×11): qty 3

## 2022-01-19 MED ORDER — LOSARTAN POTASSIUM 50 MG PO TABS
100.0000 mg | ORAL_TABLET | Freq: Every day | ORAL | Status: DC
  Administered 2022-01-19 – 2022-01-26 (×8): 100 mg via ORAL
  Filled 2022-01-19 (×7): qty 2
  Filled 2022-01-19: qty 4

## 2022-01-19 MED ORDER — MOMETASONE FURO-FORMOTEROL FUM 100-5 MCG/ACT IN AERO
2.0000 | INHALATION_SPRAY | Freq: Two times a day (BID) | RESPIRATORY_TRACT | Status: DC
  Administered 2022-01-19 – 2022-01-26 (×14): 2 via RESPIRATORY_TRACT
  Filled 2022-01-19: qty 8.8

## 2022-01-19 MED ORDER — PANTOPRAZOLE SODIUM 40 MG PO TBEC
40.0000 mg | DELAYED_RELEASE_TABLET | Freq: Every day | ORAL | Status: DC
  Administered 2022-01-19 – 2022-01-26 (×8): 40 mg via ORAL
  Filled 2022-01-19 (×8): qty 1

## 2022-01-19 NOTE — Assessment & Plan Note (Addendum)
Elevated. Continue cardizem, losartan, triamterene-HCTZ

## 2022-01-19 NOTE — Assessment & Plan Note (Signed)
3 episode of syncope with LOC with prodrome of dizziness. Appears they could be positional but orthostatic vital signs are negative. No electrolyte derangements. She does reports missing her Phenytoin with last seizure perhaps around a few months ago. -will obtain echocardiogram and keep on continuous telemetry -check phenytoin level. Obtain EEG. CT head is negative and no neurological finding-will hold off on MRI brain  -daughter also requested carotid ultrasound which is not unreasonable although low likelihood since last one in 2019 with Bilateral internal carotid arteries are 1-39%. Obtain carotid ultrasound -she also had recent travel. Check d-dimer and obtain CTA chest to rule out PE if elevated.

## 2022-01-19 NOTE — Assessment & Plan Note (Signed)
Continue PRN oxycodone and Tizanidine-pt reports these doses have been unchanged and she does not take it daily

## 2022-01-19 NOTE — H&P (Signed)
History and Physical    Patient: Gloria Hurst WUJ:[id number] DOB: May 11, 1961 DOA: 01/18/2022 DOS: the patient was seen and examined on 01/19/2022 [facility]  Patient coming from:  [facility]  Chief Complaint:  Chief Complaint  Patient presents with   Loss of Consciousness   Dizziness   HPI: Gloria Hurst is a 61 y.o. female with medical history significant of TIA (2019), seizure on phenytoin, hypertension, pulmonary hypertension, asthma and GERD who presents with concerns of syncope.  Patient reports having multiple episodes of syncope.  She had 2 episodes during the July 4 holiday.  At that time she was coming out of the pool and felt lightheaded and dizzy and asked a family member to hold her and then had loss of consciousness.  She then returned home and was walking up the stairs when again she felt unwell and lightheaded and had a second episode of loss of consciousness. Family felt she was confused afterwards.  Then 2 days ago she was walking out of a clinic and felt lightheaded.  She then got inside her car and passed out while she was there.  She notes the only symptom she feels is lightheadedness and weakness.  She cannot recall any chest pain or palpitations.  She has history of seizures with the last one potentially months ago when she felt hot sitting outside and family saw her lose consciousness with some shaking.  She does occasionally miss her phenytoin.  Denies any loss of bowel or bladder function during the syncope episodes.  No new medication changes.  She takes oxycodone and tizanidine for back pain but only takes this occasionally.  Has not felt ill otherwise.  No nausea, vomiting or diarrhea. Most recent travel was to [place] on [place] 4. She has shortness of breath and productive cough which is chronic for her. She has ongoing tobacco use of about 2 to 3 cigarettes/day.  No alcohol use since July 4.  In the ED, she was afebrile intermittently  bradycardic in the high 50s with SBP up to 180 on room air.  Negative orthostatic vital signs. CBC unremarkable.  No electrolyte derangements seen CMP. CT head and C-spine is negative.   Review of Systems: As mentioned in the history of present illness. All other systems reviewed and are negative. Past Medical History:  Diagnosis Date   Acid reflux    Back pain    Diabetes mellitus without complication (HCC)    Epilepsy (HCC)    High cholesterol    HTN (hypertension)    Obesity    Sleep apnea    Stroke [facility])    Past Surgical History:  Procedure Laterality Date   APPENDECTOMY     BREAST REDUCTION SURGERY     CARPAL TUNNEL RELEASE     CESAREAN SECTION     Social History:  reports that she has been smoking cigarettes. She has a 11.10 pack-year smoking history. She has never used smokeless tobacco. She reports that she does not currently use alcohol. She reports that she does not use drugs.  Allergies  Allergen Reactions   Bee Venom Anaphylaxis and Shortness Of Breath   Strawberry Extract Hives   Penicillins Rash    Has patient had a PCN reaction causing immediate rash, facial/tongue/throat swelling, SOB or lightheadedness with hypotension: Yes Has patient had a PCN reaction causing severe rash involving mucus membranes or skin necrosis: No Has patient had a PCN reaction that required hospitalization: No Has patient had a PCN reaction  occurring within the last 10 years: No If all of the above answers are "NO", then may proceed with Cephalosporin use.    Family History  Problem Relation Age of Onset   CAD Mother 83       Died at 51 of MI   CVA Father 43   Breast cancer Sister    Bladder Cancer Maternal Aunt        Died of bladder cancer    Prior to Admission medications   Medication Sig Start Date End Date Taking? Authorizing Provider  albuterol (PROVENTIL HFA) 108 (90 Base) MCG/ACT inhaler Inhale 2 puffs into the lungs every 6 (six) hours as needed for wheezing or  shortness of breath.    Yes [provider]  aspirin EC 81 MG EC tablet Take 1 tablet (81 mg total) by mouth daily. 01/08/18  Yes Leroy Sea, MD  budesonide-formoterol (SYMBICORT) 80-4.5 MCG/ACT inhaler Inhale 2 puffs into the lungs at bedtime.   Yes [provider]  cholecalciferol (VITAMIN D) 400 units TABS tablet Take 400 Units by mouth daily.   Yes [provider]  dexlansoprazole (DEXILANT) 60 MG capsule Take 60 mg by mouth daily.   Yes [provider]  diltiazem (TIAZAC) 300 MG 24 hr capsule Take 300 mg by mouth daily. 01/01/22  Yes [provider]  etodolac (LODINE) 400 MG tablet Take 400 mg by mouth 2 (two) times daily as needed (joint pain/stiffness).    Yes [provider]  ibuprofen (ADVIL) 800 MG tablet Take 800 mg by mouth 2 (two) times daily as needed. 12/03/21  Yes [provider]  losartan (COZAAR) 100 MG tablet Take 100 mg by mouth daily. 11/02/21  Yes [provider]  montelukast (SINGULAIR) 10 MG tablet Take 10 mg by mouth at bedtime.   Yes [provider]  Multiple Vitamin (MULTIVITAMIN WITH MINERALS) TABS tablet Take 1 tablet by mouth daily. Centrum   Yes [provider]  oxyCODONE (ROXICODONE) 15 MG immediate release tablet Take 15 mg by mouth 4 (four) times daily as needed. 01/04/22  Yes [provider]  phenytoin (DILANTIN) 100 MG ER capsule Take 400 mg by mouth daily.   Yes [provider]  rosuvastatin (CRESTOR) 20 MG tablet Take 1 tablet (20 mg total) by mouth daily. 01/07/18  Yes Leroy Sea, MD  tiZANidine (ZANAFLEX) 2 MG tablet Take 2 mg by mouth every 8 (eight) hours as needed. 11/03/21  Yes [provider]  triamterene-hydrochlorothiazide (MAXZIDE-25) 37.5-25 MG tablet Take 1 tablet by mouth daily. 12/30/21  Yes [provider]  PRESCRIPTION MEDICATION Inhale into the lungs at bedtime. CPAP    [provider]    Physical Exam: Vitals:    01/19/22 1539 01/19/22 1600 01/19/22 1813 01/19/22 2022  BP:  (!) 161/81  (!) 159/73  Pulse:  62  63  Resp:  18    Temp: 98.2 F (36.8 C)   98.2 F (36.8 C)  TempSrc: Oral   Oral  SpO2:  97%  95%  Weight:   99.8 kg   Height:   5\' 2"  (1.575 m)    Constitutional: NAD, calm, comfortable, female appearing somewhat older than stated age sitting upright in bed Eyes: lids and conjunctivae normal ENMT: Mucous membranes are moist. Neck: normal, supple Respiratory: clear to auscultation bilaterally, no wheezing, no crackles. Normal respiratory effort. No accessory muscle use.  Has occasional productive coughing. Cardiovascular: Regular rate and rhythm, no murmurs / rubs / gallops. No  extremity edema.  Abdomen: Soft, nondistended and nontender.  Bowel sounds positive.  Musculoskeletal: no clubbing / cyanosis. No joint deformity upper and lower extremities. Good ROM, no contractures. Normal muscle tone.  Skin: no rashes, lesions, ulcers.  Neurologic: CN 2-12 grossly intact.  No facial asymmetry.  Sensation intact.  Intact finger-nose.  Strength 5/5 in all 4.  Psychiatric: Normal judgment and insight. Alert and oriented x 3. Normal mood. Data Reviewed:  See HPI  Assessment and Plan: * Syncope 3 episode of syncope with LOC with prodrome of dizziness. Appears they could be positional but orthostatic vital signs are negative. No electrolyte derangements. She does reports missing her Phenytoin with last seizure perhaps around a few months ago. -will obtain echocardiogram and keep on continuous telemetry -check phenytoin level. Obtain EEG. CT head is negative and no neurological finding-will hold off on MRI brain  -daughter also requested carotid ultrasound which is not unreasonable although low likelihood since last one in 2019 with Bilateral internal carotid arteries are 1-39%. Obtain carotid ultrasound -she also had recent travel. Check d-dimer and obtain CTA chest to rule out PE if elevated.    Seizures (HCC) Patient reports occasionally missing her medication and has been documented to have noncompliance in the past.  Will check phenytoin level and continue here.  Low back pain Continue PRN oxycodone and Tizanidine-pt reports these doses have been unchanged and she does not take it daily  Essential hypertension Elevated. Continue cardizem, losartan, triamterene-HCTZ      Advance Care Planning:   Code Status: Full Code   Consults: none  Family Communication: Daughter at bedside  Severity of Illness: The appropriate patient status for this patient is OBSERVATION. Observation status is judged to be reasonable and necessary in order to provide the required intensity of service to ensure the patient's safety. The patient's presenting symptoms, physical exam findings, and initial radiographic and laboratory data in the context of their medical condition is felt to place them at decreased risk for further clinical deterioration. Furthermore, it is anticipated that the patient will be medically stable for discharge from the hospital within 2 midnights of admission.   Author: Anselm Jungling, DO 01/19/2022 9:43 PM  For on call review [email or web address].

## 2022-01-19 NOTE — ED Notes (Signed)
Pt resting quietly with eyes closed  Visitor at bedside   

## 2022-01-19 NOTE — Progress Notes (Signed)
EEG complete - results pending 

## 2022-01-19 NOTE — ED Notes (Signed)
ED TO INPATIENT HANDOFF REPORT  ED Nurse Name and Phone #: Asencion Islam RN:  [phone]  S Name/Age/Gender Gloria Hurst 61 y.o. female Room/Bed: MH08/MH08  Code Status   Code Status: Prior  Home/SNF/Other Home Patient oriented to: self, place, time, and situation Is this baseline? Yes   Triage Complete: Triage complete  Chief Complaint Syncope [R55]  Triage Note Reports pt has been having ongoing issues with syncopal episodes since 7/4. Yesterday had syncopal episode. Today headache, dizziness, and tingling in face. Denies head trauma, blood thinners. Hx of CVA. LKW 3 days ago.    Allergies Allergies  Allergen Reactions   Bee Venom Anaphylaxis and Shortness Of Breath   Strawberry Extract Hives   Penicillins Rash    Has patient had a PCN reaction causing immediate rash, facial/tongue/throat swelling, SOB or lightheadedness with hypotension: Yes Has patient had a PCN reaction causing severe rash involving mucus membranes or skin necrosis: No Has patient had a PCN reaction that required hospitalization: No Has patient had a PCN reaction occurring within the last 10 years: No If all of the above answers are "NO", then may proceed with Cephalosporin use.    Level of Care/Admitting Diagnosis ED Disposition     ED Disposition  Admit   Condition  --   Comment  Hospital Area: [facility] [[id number]]  Level of Care: Telemetry Medical [104]  Interfacility transfer: Yes  May place patient in observation at [facility] or [facility] if equivalent level of care is available:: No  Covid Evaluation: Asymptomatic - no recent exposure (last 10 days) testing not required  Diagnosis: Syncope [206001]  Admitting Physician: Eduard Clos [phone]  Attending Physician: Eduard Clos Florian.Pax          B Medical/Surgery History Past Medical History:  Diagnosis Date   Acid reflux    Back pain    Diabetes mellitus without complication (HCC)    Epilepsy  (HCC)    High cholesterol    HTN (hypertension)    Obesity    Sleep apnea    Stroke [facility])    Past Surgical History:  Procedure Laterality Date   APPENDECTOMY     BREAST REDUCTION SURGERY     CARPAL TUNNEL RELEASE     CESAREAN SECTION       A IV Location/Drains/Wounds Patient Lines/Drains/Airways Status     Active Line/Drains/Airways     Name Placement date Placement time Site Days   Peripheral IV 01/18/22 20 G Anterior;Right Forearm 01/18/22  --  Forearm  1   Wound / Incision (Open or Dehisced) 01/18/22 Other (Comment) Head Posterior 01/18/22  1900  Head  1            Intake/Output Last 24 hours No intake or output data in the 24 hours ending 01/19/22 1451  Labs/Imaging Results for orders placed or performed during the hospital encounter of 01/18/22 (from the past 48 hour(s))  CBC with Differential     Status: None   Collection Time: 01/18/22  6:04 PM  Result Value Ref Range   WBC 10.4 4.0 - 10.5 K/uL   RBC 5.10 3.87 - 5.11 MIL/uL   Hemoglobin 13.7 12.0 - 15.0 g/dL   HCT 84.6 96.2 - 95.2 %   MCV 81.8 80.0 - 100.0 fL   MCH 26.9 26.0 - 34.0 pg   MCHC 32.9 30.0 - 36.0 g/dL   RDW 84.1 32.4 - 40.1 %   Platelets 254 150 - 400 K/uL   nRBC  0.0 0.0 - 0.2 %   Neutrophils Relative % 60 %   Neutro Abs 6.3 1.7 - 7.7 K/uL   Lymphocytes Relative 27 %   Lymphs Abs 2.8 0.7 - 4.0 K/uL   Monocytes Relative 8 %   Monocytes Absolute 0.8 0.1 - 1.0 K/uL   Eosinophils Relative 4 %   Eosinophils Absolute 0.4 0.0 - 0.5 K/uL   Basophils Relative 1 %   Basophils Absolute 0.1 0.0 - 0.1 K/uL   Immature Granulocytes 0 %   Abs Immature Granulocytes 0.04 0.00 - 0.07 K/uL    Comment: Performed at [facility], [address] [place], [place] [address]  Comprehensive metabolic panel     Status: Abnormal   Collection Time: 01/18/22  6:04 PM  Result Value Ref Range   Sodium 136 135 - 145 mmol/L   Potassium 3.6 3.5 - 5.1 mmol/L   Chloride 102 98 - 111 mmol/L   CO2 27 22 - 32  mmol/L   Glucose, Bld 123 (H) 70 - 99 mg/dL    Comment: Glucose reference range applies only to samples taken after fasting for at least 8 hours.   BUN 13 6 - 20 mg/dL   Creatinine, Ser 2.45 0.44 - 1.00 mg/dL   Calcium 9.4 8.9 - 80.9 mg/dL   Total Protein 7.5 6.5 - 8.1 g/dL   Albumin 4.0 3.5 - 5.0 g/dL   AST 21 15 - 41 U/L   ALT 21 0 - 44 U/L   Alkaline Phosphatase 85 38 - 126 U/L   Total Bilirubin 0.5 0.3 - 1.2 mg/dL   GFR, Estimated >98 >33 mL/min    Comment: (NOTE) Calculated using the CKD-EPI Creatinine Equation (2021)    Anion gap 7 5 - 15    Comment: Performed at [facility], [address] [place], [place] [address]  Lipase, blood     Status: None   Collection Time: 01/18/22  6:04 PM  Result Value Ref Range   Lipase 44 11 - 51 U/L    Comment: Performed at [facility], [address] [place], [place] [address]  Urinalysis, Routine w reflex microscopic     Status: None   Collection Time: 01/18/22  6:04 PM  Result Value Ref Range   Color, Urine YELLOW YELLOW   APPearance CLEAR CLEAR   Specific Gravity, Urine 1.015 1.005 - 1.030   pH 6.5 5.0 - 8.0   Glucose, UA NEGATIVE NEGATIVE mg/dL   Hgb urine dipstick NEGATIVE NEGATIVE   Bilirubin Urine NEGATIVE NEGATIVE   Ketones, ur NEGATIVE NEGATIVE mg/dL   Protein, ur NEGATIVE NEGATIVE mg/dL   Nitrite NEGATIVE NEGATIVE   Leukocytes,Ua NEGATIVE NEGATIVE    Comment: Microscopic not done on urines with negative protein, blood, leukocytes, nitrite, or glucose < 500 mg/dL. Performed at [facility], [address] [place], [place] [address]   Ethanol     Status: None   Collection Time: 01/18/22  6:04 PM  Result Value Ref Range   Alcohol, Ethyl (B) <10 <10 mg/dL    Comment: (NOTE) Lowest detectable limit for serum alcohol is 10 mg/dL.  For medical purposes only. Performed at [facility], [address] [place], [place] [address]   Troponin I (High Sensitivity)     Status: None    Collection Time: 01/18/22  6:04 PM  Result Value Ref Range   Troponin I (High Sensitivity) 4 <18 ng/L  Comment: (NOTE) Elevated high sensitivity troponin I (hsTnI) values and significant  changes across serial measurements may suggest ACS but many other  chronic and acute conditions are known to elevate hsTnI results.  Refer to the "Links" section for chest pain algorithms and additional  guidance. Performed at [facility], [address] [place], [place] [address]   CBG monitoring, ED     Status: Abnormal   Collection Time: 01/19/22  8:30 AM  Result Value Ref Range   Glucose-Capillary 125 (H) 70 - 99 mg/dL    Comment: Glucose reference range applies only to samples taken after fasting for at least 8 hours.   CT Head Wo Contrast  Result Date: 01/18/2022 CLINICAL DATA:  Syncopal episodes, headache, dizziness EXAM: CT HEAD WITHOUT CONTRAST CT CERVICAL SPINE WITHOUT CONTRAST TECHNIQUE: Multidetector CT imaging of the head and cervical spine was performed following the standard protocol without intravenous contrast. Multiplanar CT image reconstructions of the cervical spine were also generated. RADIATION DOSE REDUCTION: This exam was performed according to the departmental dose-optimization program which includes automated exposure control, adjustment of the mA and/or kV according to patient size and/or use of iterative reconstruction technique. COMPARISON:  05/20/2021 CT head and cervical spine FINDINGS: CT HEAD FINDINGS Brain: No evidence of acute infarct, hemorrhage, mass, mass effect, or midline shift. No hydrocephalus or extra-axial fluid collection. Vascular: No hyperdense vessel. Skull: Normal. Negative for fracture or focal lesion. Sinuses/Orbits: Mucous retention cysts in the right-greater-than-left maxillary sinus. Mucosal thickening in the ethmoid air cells. No acute finding in the orbits. Other: The mastoid air cells are well aerated. CT CERVICAL SPINE FINDINGS  Alignment: Reversal of the normal cervical lordosis. No traumatic listhesis. Skull base and vertebrae: No acute fracture. No primary bone lesion or focal pathologic process. Soft tissues and spinal canal: No prevertebral fluid or swelling. No visible canal hematoma. Disc levels:  No high-grade spinal canal stenosis. Upper chest: No focal pulmonary opacity or pleural effusion in the imaged lungs. Other: None. IMPRESSION: 1.  No acute intracranial process. 2.  No acute fracture or traumatic listhesis in the cervical spine. Electronically Signed   By: Wiliam Ke M.D.   On: 01/18/2022 18:39   CT Cervical Spine Wo Contrast  Result Date: 01/18/2022 CLINICAL DATA:  Syncopal episodes, headache, dizziness EXAM: CT HEAD WITHOUT CONTRAST CT CERVICAL SPINE WITHOUT CONTRAST TECHNIQUE: Multidetector CT imaging of the head and cervical spine was performed following the standard protocol without intravenous contrast. Multiplanar CT image reconstructions of the cervical spine were also generated. RADIATION DOSE REDUCTION: This exam was performed according to the departmental dose-optimization program which includes automated exposure control, adjustment of the mA and/or kV according to patient size and/or use of iterative reconstruction technique. COMPARISON:  05/20/2021 CT head and cervical spine FINDINGS: CT HEAD FINDINGS Brain: No evidence of acute infarct, hemorrhage, mass, mass effect, or midline shift. No hydrocephalus or extra-axial fluid collection. Vascular: No hyperdense vessel. Skull: Normal. Negative for fracture or focal lesion. Sinuses/Orbits: Mucous retention cysts in the right-greater-than-left maxillary sinus. Mucosal thickening in the ethmoid air cells. No acute finding in the orbits. Other: The mastoid air cells are well aerated. CT CERVICAL SPINE FINDINGS Alignment: Reversal of the normal cervical lordosis. No traumatic listhesis. Skull base and vertebrae: No acute fracture. No primary bone lesion or focal  pathologic process. Soft tissues and spinal canal: No prevertebral fluid or swelling. No visible canal hematoma. Disc levels:  No high-grade spinal canal stenosis. Upper chest: No focal pulmonary opacity  or pleural effusion in the imaged lungs. Other: None. IMPRESSION: 1.  No acute intracranial process. 2.  No acute fracture or traumatic listhesis in the cervical spine. Electronically Signed   By: Wiliam Ke M.D.   On: 01/18/2022 18:39   DG Chest Portable 1 View  Result Date: 01/18/2022 CLINICAL DATA:  Weakness EXAM: PORTABLE CHEST 1 VIEW COMPARISON:  09/11/2021 FINDINGS: The heart size and mediastinal contours are within normal limits. Both lungs are clear. The visualized skeletal structures are unremarkable. IMPRESSION: No active disease. Electronically Signed   By: Jasmine Pang M.D.   On: 01/18/2022 17:44    Pending Labs Unresulted Labs (From admission, onward)     Start     Ordered   01/18/22 1942  Phenytoin level, free and total  Once,   STAT        01/18/22 1942            Vitals/Pain Today's Vitals   01/19/22 1132 01/19/22 1310 01/19/22 1330 01/19/22 1420  BP:  (!) 165/90  (!) 158/86  Pulse:  63  65  Resp:  15  17  Temp:      TempSrc:      SpO2:  94%  94%  Weight:      Height:      PainSc: 7   5      Isolation Precautions No active isolations  Medications Medications  pantoprazole (PROTONIX) EC tablet 40 mg (40 mg Oral Given 01/19/22 1124)  diltiazem (TIAZAC) 24 hr capsule 300 mg (300 mg Oral Not Given 01/19/22 1100)  losartan (COZAAR) tablet 100 mg (100 mg Oral Given 01/19/22 1124)  phenytoin (DILANTIN) ER capsule 400 mg (400 mg Oral Given 01/19/22 1124)  rosuvastatin (CRESTOR) tablet 20 mg (20 mg Oral Not Given 01/19/22 1213)  triamterene-hydrochlorothiazide (MAXZIDE-25) 37.5-25 MG per tablet 1 tablet (1 tablet Oral Not Given 01/19/22 1100)  aspirin EC tablet 81 mg (81 mg Oral Given 01/19/22 1124)  mometasone-formoterol (DULERA) 100-5 MCG/ACT inhaler 2 puff (2 puffs  Inhalation Not Given 01/19/22 1214)  montelukast (SINGULAIR) tablet 10 mg (has no administration in time range)  oxyCODONE (Oxy IR/ROXICODONE) immediate release tablet 15 mg (15 mg Oral Given 01/19/22 1132)  albuterol (PROVENTIL) (2.5 MG/3ML) 0.083% nebulizer solution 2.5 mg (has no administration in time range)  sodium chloride 0.9 % bolus 1,000 mL (0 mLs Intravenous Stopped 01/18/22 1949)  prochlorperazine (COMPAZINE) injection 10 mg (10 mg Intravenous Given 01/18/22 1852)  diphenhydrAMINE (BENADRYL) injection 12.5 mg (12.5 mg Intravenous Given 01/18/22 1849)    Mobility walks Low fall risk   Focused Assessments Neuro Assessment Handoff:  Swallow screen pass? Yes  Cardiac Rhythm: Normal sinus rhythm NIH Stroke Scale ( + Modified Stroke Scale Criteria)  Interval: Initial Level of Consciousness (1a.)   : Alert, keenly responsive LOC Questions (1b. )   +: Answers both questions correctly LOC Commands (1c. )   + : Performs both tasks correctly Best Gaze (2. )  +: Normal Visual (3. )  +: No visual loss Facial Palsy (4. )    : Normal symmetrical movements Motor Arm, Left (5a. )   +: No drift Motor Arm, Right (5b. )   +: No drift Motor Leg, Left (6a. )   +: No drift Motor Leg, Right (6b. )   +: No drift Limb Ataxia (7. ): Absent Sensory (8. )   +: Mild-to-moderate sensory loss, patient feels pinprick is less sharp or is dull on the affected side, or there is a  loss of superficial pain with pinprick, but patient is aware of being touched (numbness left lip) Best Language (9. )   +: No aphasia Dysarthria (10. ): Normal Extinction/Inattention (11.)   +: No Abnormality Modified SS Total  +: 1 Complete NIHSS TOTAL: 4     Neuro Assessment: Exceptions to WDL Neuro Checks:   Initial (01/18/22 1731)  Last Documented NIHSS Modified Score: 1 (01/18/22 1921) Has TPA been given? No If patient is a Neuro Trauma and patient is going to OR before floor call report to 4N Charge nurse: [phone]  or [phone]   R Recommendations: See Admitting Provider Note  Report given to:  Dorathy DaftKayla, RN   Additional Notes:

## 2022-01-19 NOTE — ED Notes (Signed)
Pt's daughter voicing concerns that pt has sleep apnea and wears c pap at home

## 2022-01-19 NOTE — Assessment & Plan Note (Signed)
Patient reports occasionally missing her medication and has been documented to have noncompliance in the past.  Will check phenytoin level and continue here.

## 2022-01-20 ENCOUNTER — Observation Stay (HOSPITAL_BASED_OUTPATIENT_CLINIC_OR_DEPARTMENT_OTHER): Payer: Medicare Other

## 2022-01-20 ENCOUNTER — Observation Stay (HOSPITAL_COMMUNITY): Payer: Medicare Other

## 2022-01-20 DIAGNOSIS — R55 Syncope and collapse: Secondary | ICD-10-CM

## 2022-01-20 DIAGNOSIS — R42 Dizziness and giddiness: Secondary | ICD-10-CM

## 2022-01-20 DIAGNOSIS — R569 Unspecified convulsions: Secondary | ICD-10-CM | POA: Diagnosis not present

## 2022-01-20 LAB — ECHOCARDIOGRAM COMPLETE
AV Mean grad: 6 mmHg
AV Peak grad: 12.3 mmHg
Ao pk vel: 1.75 m/s
Area-P 1/2: 3.85 cm2
Height: 62 in
Weight: 3520 oz

## 2022-01-20 LAB — COMPREHENSIVE METABOLIC PANEL
ALT: 21 U/L (ref 0–44)
AST: 16 U/L (ref 15–41)
Albumin: 3.5 g/dL (ref 3.5–5.0)
Alkaline Phosphatase: 63 U/L (ref 38–126)
Anion gap: 9 (ref 5–15)
BUN: 10 mg/dL (ref 6–20)
CO2: 24 mmol/L (ref 22–32)
Calcium: 8.7 mg/dL — ABNORMAL LOW (ref 8.9–10.3)
Chloride: 101 mmol/L (ref 98–111)
Creatinine, Ser: 0.84 mg/dL (ref 0.44–1.00)
GFR, Estimated: >60 mL/min (ref >60)
Glucose, Bld: 105 mg/dL — ABNORMAL HIGH (ref 70–99)
Potassium: 3.7 mmol/L (ref 3.5–5.1)
Sodium: 134 mmol/L — ABNORMAL LOW (ref 135–145)
Total Bilirubin: 0.6 mg/dL (ref 0.3–1.2)
Total Protein: 6.4 g/dL — ABNORMAL LOW (ref 6.5–8.1)

## 2022-01-20 MED ORDER — CARVEDILOL 6.25 MG PO TABS: 6.2500 mg | ORAL_TABLET | Freq: Two times a day (BID) | ORAL | Status: DC

## 2022-01-20 MED ORDER — SENNOSIDES-DOCUSATE SODIUM 8.6-50 MG PO TABS
1.0000 | ORAL_TABLET | Freq: Once | ORAL | Status: AC
Start: 2022-01-20 — End: 2022-01-20
  Administered 2022-01-20: 1 via ORAL
  Filled 2022-01-20: qty 1

## 2022-01-20 MED ORDER — POLYETHYLENE GLYCOL 3350 17 G PO PACK
17.0000 g | PACK | Freq: Every day | ORAL | Status: DC
  Administered 2022-01-21 – 2022-01-26 (×6): 17 g via ORAL
  Filled 2022-01-20 (×6): qty 1

## 2022-01-20 MED ORDER — DILTIAZEM HCL ER COATED BEADS 300 MG PO CP24
300.0000 mg | ORAL_CAPSULE | Freq: Every day | ORAL | Status: DC
  Administered 2022-01-21: 300 mg via ORAL
  Filled 2022-01-20 (×3): qty 1

## 2022-01-20 MED ORDER — IOHEXOL 350 MG/ML SOLN
100.0000 mL | Freq: Once | INTRAVENOUS | Status: AC | PRN
  Administered 2022-01-20: 100 mL via INTRAVENOUS

## 2022-01-20 MED ORDER — MECLIZINE HCL 12.5 MG PO TABS
12.5000 mg | ORAL_TABLET | Freq: Three times a day (TID) | ORAL | Status: DC | PRN
Start: 2022-01-20 — End: 2022-01-21
  Administered 2022-01-20: 12.5 mg via ORAL
  Filled 2022-01-20 (×2): qty 1

## 2022-01-20 NOTE — Progress Notes (Signed)
VASCULAR LAB    Carotid duplex has been performed.  See CV proc for preliminary results.   Aubery Date, RVT 01/20/2022, 9:33 AM

## 2022-01-20 NOTE — Procedures (Signed)
Patient Name: Gloria Hurst  MRN: [id number]  Epilepsy Attending: Charlsie Quest  Referring Physician/Provider: Anselm Jungling, DO Date: 01/19/2022 Duration: 21.25 mins  Patient history: 61yo F with 3 episode of syncope with LOC with prodrome of dizziness. EEG to evaluate for seizure  Level of alertness: Awake, drowsy  AEDs during EEG study: None  Technical aspects: This EEG study was done with scalp electrodes positioned according to the 10-20 International system of electrode placement. Electrical activity was acquired at a sampling rate of 500Hz  and reviewed with a high frequency filter of 70Hz  and a low frequency filter of 1Hz . EEG data were recorded continuously and digitally stored.   Description: The posterior dominant rhythm consists of 8-9 Hz activity of moderate voltage (25-35 uV) seen predominantly in posterior head regions, symmetric and reactive to eye opening and eye closing. Drowsiness was characterized by attenuation of the posterior background rhythm. Hyperventilation and photic stimulation were not performed.     IMPRESSION: This study is within normal limits. No seizures or epileptiform discharges were seen throughout the recording.  Terrisha Lopata 

## 2022-01-20 NOTE — Care Management Obs Status (Signed)
MEDICARE OBSERVATION STATUS NOTIFICATION   Patient Details  Name: Gloria Hurst MRN: [id number] Date of Birth: Aug 04, 1960   Medicare Observation Status Notification Given:  Yes    Lawerance Sabal, RN 01/20/2022, 3:24 PM

## 2022-01-20 NOTE — Progress Notes (Signed)
  Echocardiogram 2D Echocardiogram has been performed.  Gloria Hurst F 01/20/2022, 10:10 AM

## 2022-01-20 NOTE — Progress Notes (Signed)
PROGRESS NOTE  Gloria Hurst  DOB: 1960/11/05  PCP: [facility] XBM:[id number]  DOA: 01/18/2022  LOS: 0 days  Hospital Day: 3  Brief narrative: Gloria Hurst is a 61 y.o. female with PMH significant for TIA in 2019, seizure on phenytoin, HTN, pulmonary hypertension, asthma, GERD Patient presented to the ED on 7/20 with complaint of multiple episodes of syncope. She had 2 episodes during the July 4 holiday.  At that time she was coming out of the pool and felt lightheaded and dizzy and asked a family member to hold her and then had loss of consciousness.  She then returned home and was walking up the stairs when again she felt unwell and lightheaded and had a second episode of loss of consciousness. Family felt she was confused afterwards. 2 days prior to presentation, she was walking out of a clinic and felt lightheaded.  She then got inside her car and passed out while she was there. Lightheadedness and weakness are her premonition symptoms each time.  No complaint of chest pain or palpitation. Reports compliance to medicines.  No recent changes in medications. She takes oxycodone and tizanidine for back pain but only takes this occasionally.  In the ED, patient is afebrile, heart rate in 60s, blood pressure in 160s, breathing on room air. Unremarkable CBC, BMP CT head and CT cervical spine did not show any acute intracranial process or fracture or subluxation of cervical spine D-dimer slightly elevated but CT angio chest did not show any evidence of acute PE or any other acute cardiopulmonary process. Admitted to hospitalist service  Subjective: Patient was seen and examined this morning.  Pleasant middle-aged female.  Lying on bed.  Not in distress at rest.  She mentioned that earlier this morning, she got up with help to use the bathroom but while standing up, she felt lightheaded and almost passed out.  It seems the nursing staff did not do orthostatic vital signs at that  time. Her blood pressure at rest this morning has been running elevated over 150  Assessment and plan: Syncope -Presented with multiple episodes of syncope in last 3 weeks with premonition of lightheadedness, dizziness  -Differential diagnosis include orthostatic syncope, cardiac arrhythmia. -Patient reports episode of dizziness on standing this morning.  Obtain orthostatic vital signs.  Continue to monitor in telemetry -Pending echocardiogram, carotid duplex  Essential hypertension -Blood pressure elevated throughout the night to 160s  -PTA on cardizem, losartan, triamterene-HCTZ -She received all those this morning.  She remains bradycardic in 50s in the monitor.  No history of A-fib.  Was seen by cardiologist Dr. Tollie Pizza at [facility] few months ago.  Had a Zio patch in place.  Blood pressure management was left up to the primary care provider.  History of seizure -Reports compliance to phenytoin.  Phenytoin level sent. -EEG unremarkable    Low back pain Continue PRN oxycodone and Tizanidine-pt reports these doses have been unchanged and she does not take it daily   Goals of care   Code Status: Full Code    Mobility: Encourage ambulation.  Skin assessment:     Nutritional status:  Body mass index is 40.24 kg/m.          Diet:  Diet Order             Diet Heart Room service appropriate? Yes; Fluid consistency: Thin  Diet effective now  DVT prophylaxis:  enoxaparin (LOVENOX) injection 40 mg Start: 01/19/22 2200   Antimicrobials: None Fluid: None at this time Consultants: None Family Communication: None at bedside  Status is: Observation  Continue in-hospital care because: Continues to remain dizzy on standing up.  Continue to monitor vital signs Level of care: Telemetry Medical   Dispo: The patient is from: Home              Anticipated d/c is to: Pending clinical course              Patient currently is not medically stable to  d/c.   Difficult to place patient No     Infusions:    Scheduled Meds:  aspirin EC  81 mg Oral Daily   [START ON 01/21/2022] diltiazem  300 mg Oral Daily   enoxaparin (LOVENOX) injection  40 mg Subcutaneous Q24H   losartan  100 mg Oral Daily   mometasone-formoterol  2 puff Inhalation BID   montelukast  10 mg Oral QHS   pantoprazole  40 mg Oral Daily   phenytoin  400 mg Oral Daily   pneumococcal 20-valent conjugate vaccine  0.5 mL Intramuscular Tomorrow-1000   rosuvastatin  20 mg Oral Daily    PRN meds: albuterol, meclizine, oxyCODONE, tiZANidine   Antimicrobials: Anti-infectives (From admission, onward)    None       Objective: Vitals:   01/20/22 0821 01/20/22 1128  BP: (!) 172/69 (!) 152/75  Pulse: 60 (!) 54  Resp: 18 19  Temp:  98.4 F (36.9 C)  SpO2: 96% 98%   No intake or output data in the 24 hours ending 01/20/22 1312 Filed Weights   01/18/22 1726 01/19/22 1813  Weight: 97.5 kg 99.8 kg   Weight change: 2.268 kg Body mass index is 40.24 kg/m.   Physical Exam: General exam: Pleasant, middle-aged.  Not in physical distress at rest Skin: No rashes, lesions or ulcers. HEENT: Atraumatic, normocephalic, no obvious bleeding Lungs: Clear to auscultation bilaterally CVS: Sinus bradycardia, no murmur GI/Abd soft, nontender, nondistended, bowel sound present CNS: Alert, awake, oriented x3 Psychiatry: Mood appropriate Extremities: No pedal edema, no calf tenderness  Data Review: I have personally reviewed the laboratory data and studies available.  F/u labs ordered Unresulted Labs (From admission, onward)     Start     Ordered   01/18/22 1942  Phenytoin level, free and total  Once,   R        01/18/22 1942            Signed, Lorin Glass, MD [facility] 01/20/2022

## 2022-01-21 ENCOUNTER — Observation Stay (HOSPITAL_COMMUNITY): Payer: Medicare Other

## 2022-01-21 DIAGNOSIS — R55 Syncope and collapse: Secondary | ICD-10-CM | POA: Diagnosis not present

## 2022-01-21 MED ORDER — MECLIZINE HCL 12.5 MG PO TABS
12.5000 mg | ORAL_TABLET | Freq: Three times a day (TID) | ORAL | Status: DC
  Filled 2022-01-21 (×2): qty 1

## 2022-01-21 MED ORDER — GADOBUTROL 1 MMOL/ML IV SOLN
10.0000 mL | Freq: Once | INTRAVENOUS | Status: AC | PRN
  Administered 2022-01-21: 10 mL via INTRAVENOUS

## 2022-01-21 MED ORDER — MECLIZINE HCL 12.5 MG PO TABS
12.5000 mg | ORAL_TABLET | Freq: Three times a day (TID) | ORAL | Status: DC | PRN
  Administered 2022-01-21 – 2022-01-26 (×3): 12.5 mg via ORAL
  Filled 2022-01-21 (×4): qty 1

## 2022-01-21 MED ORDER — LORAZEPAM 2 MG/ML IJ SOLN
1.0000 mg | Freq: Once | INTRAMUSCULAR | Status: AC
Start: 2022-01-21 — End: 2022-01-21
  Administered 2022-01-21: 1 mg via INTRAVENOUS
  Filled 2022-01-21: qty 1

## 2022-01-21 NOTE — Progress Notes (Signed)
PROGRESS NOTE  Gloria Hurst  DOB: 1961-01-31  PCP: [facility] CXK:[id number]  DOA: 01/18/2022  LOS: 0 days  Hospital Day: 4  Brief narrative: Gloria Hurst is a 61 y.o. female with PMH significant for TIA in 2019, seizure on phenytoin, HTN, pulmonary hypertension, asthma, GERD Patient presented to the ED on 7/20 with complaint of multiple episodes of syncope. She had 2 episodes during the July 4 holiday.  At that time she was coming out of the pool and felt lightheaded and dizzy and asked a family member to hold her and then had loss of consciousness.  She then returned home and was walking up the stairs when again she felt unwell and lightheaded and had a second episode of loss of consciousness. Family felt she was confused afterwards. 2 days prior to presentation, she was walking out of a clinic and felt lightheaded.  She then got inside her car and passed out while she was there. Lightheadedness and weakness are her premonition symptoms each time.  No complaint of chest pain or palpitation. Reports compliance to medicines.  No recent changes in medications. She takes oxycodone and tizanidine for back pain but only takes this occasionally.  In the ED, patient is afebrile, heart rate in 60s, blood pressure in 160s, breathing on room air. Unremarkable CBC, BMP CT head and CT cervical spine did not show any acute intracranial process or fracture or subluxation of cervical spine D-dimer slightly elevated but CT angio chest did not show any evidence of acute PE or any other acute cardiopulmonary process. Admitted to hospitalist service  Subjective: Patient was seen and examined this morning. Lying on bed.  Not in distress.  No new symptoms.  Underwent MRI brain this morning.  No significant finding.  On walking on the hallway, patient continues to feel dizzy  Assessment and plan: Syncope -Presented with multiple episodes of syncope in last 3 weeks with premonition of  lightheadedness, dizziness  -Extensive work-up was done to rule out differential diagnoses: Orthostatic syncope, cardiac arrhythmia, posterior circulation stroke -Echocardiogram with normal EF, carotid duplex with nonsignificant stenosis. -MRI brain and MRA head and neck done this morning did not show any evidence of acute infarct or large vessel occlusion.  It showed mild chronic small vessel disease.  It also showed partially empty sella turcica was congested with pseudotumor cerebri. -Patient still feels dizzy and walking this morning. -meclizine PRN.  Essential hypertension -Blood pressure elevated throughout the night to 160s  -PTA on cardizem, losartan, triamterene-HCTZ -Continue Cardizem and losartan.  Triamterene-HCTZ on hold. -No history of A-fib.  Was seen by cardiologist Dr. Tollie Pizza at [facility] few months ago.  Had a Zio patch in place.  Blood pressure management was left up to the primary care provider.  History of seizure -Reports compliance to phenytoin.  Phenytoin level sent. -EEG unremarkable    Low back pain Continue PRN oxycodone and Tizanidine-pt reports these doses have been unchanged and she does not take it daily   Goals of care   Code Status: Full Code    Mobility: Encourage ambulation.  Skin assessment:     Nutritional status:  Body mass index is 40.24 kg/m.          Diet:  Diet Order             Diet Heart Room service appropriate? Yes; Fluid consistency: Thin  Diet effective now  DVT prophylaxis:  enoxaparin (LOVENOX) injection 40 mg Start: 01/19/22 2200   Antimicrobials: None Fluid: None at this time Consultants: None Family Communication: None at bedside  Status is: Observation  Continue in-hospital care because: Continues to remain dizzy on standing up.  Continue to monitor vital signs Level of care: Telemetry Medical   Dispo: The patient is from: Home              Anticipated d/c is to: Pending  clinical course              Patient currently is not medically stable to d/c.   Difficult to place patient No     Infusions:    Scheduled Meds:  aspirin EC  81 mg Oral Daily   diltiazem  300 mg Oral Daily   enoxaparin (LOVENOX) injection  40 mg Subcutaneous Q24H   losartan  100 mg Oral Daily   mometasone-formoterol  2 puff Inhalation BID   montelukast  10 mg Oral QHS   pantoprazole  40 mg Oral Daily   phenytoin  400 mg Oral Daily   pneumococcal 20-valent conjugate vaccine  0.5 mL Intramuscular Tomorrow-1000   polyethylene glycol  17 g Oral Daily   rosuvastatin  20 mg Oral Daily    PRN meds: albuterol, meclizine, oxyCODONE, tiZANidine   Antimicrobials: Anti-infectives (From admission, onward)    None       Objective: Vitals:   01/20/22 2324 01/21/22 0300  BP: (!) 170/88   Pulse: (!) 59   Resp: 18 19  Temp: 98.1 F (36.7 C) 98.4 F (36.9 C)  SpO2: 99% 96%   No intake or output data in the 24 hours ending 01/21/22 1434 Filed Weights   01/18/22 1726 01/19/22 1813  Weight: 97.5 kg 99.8 kg   Weight change:  Body mass index is 40.24 kg/m.   Physical Exam: General exam: Pleasant, middle-aged.  Not in physical distress at rest Skin: No rashes, lesions or ulcers. HEENT: Atraumatic, normocephalic, no obvious bleeding Lungs: Clear to auscultation bilaterally CVS: Sinus bradycardia, no murmur GI/Abd soft, nontender, nondistended, bowel sound present CNS: Alert, awake, oriented x3 Psychiatry: Frustrated because of persistent dizziness Extremities: No pedal edema, no calf tenderness  Data Review: I have personally reviewed the laboratory data and studies available.  F/u labs ordered Unresulted Labs (From admission, onward)     Start     Ordered   01/18/22 1942  Phenytoin level, free and total  Once,   R        01/18/22 1942            Signed, Lorin Glass, MD [facility] 01/21/2022

## 2022-01-22 ENCOUNTER — Observation Stay (HOSPITAL_COMMUNITY): Payer: Medicare Other

## 2022-01-22 DIAGNOSIS — R55 Syncope and collapse: Secondary | ICD-10-CM | POA: Diagnosis not present

## 2022-01-22 LAB — PHENYTOIN LEVEL, FREE AND TOTAL
Phenytoin, Free: NOT DETECTED ug/mL (ref 1.0–2.0)
Phenytoin, Total: 2 ug/mL — ABNORMAL LOW (ref 10.0–20.0)

## 2022-01-22 MED ORDER — DILTIAZEM HCL ER COATED BEADS 240 MG PO CP24
240.0000 mg | ORAL_CAPSULE | Freq: Every day | ORAL | Status: DC
  Administered 2022-01-22 – 2022-01-26 (×5): 240 mg via ORAL
  Filled 2022-01-22 (×5): qty 1

## 2022-01-22 MED ORDER — ALPRAZOLAM 0.5 MG PO TABS
0.5000 mg | ORAL_TABLET | Freq: Once | ORAL | Status: AC
  Administered 2022-01-22: 0.5 mg via ORAL
  Filled 2022-01-22: qty 1

## 2022-01-22 MED ORDER — DIPHENHYDRAMINE HCL 25 MG PO CAPS
25.0000 mg | ORAL_CAPSULE | Freq: Once | ORAL | Status: AC
  Administered 2022-01-22: 25 mg via ORAL
  Filled 2022-01-22: qty 1

## 2022-01-22 MED ORDER — IOHEXOL 350 MG/ML SOLN
75.0000 mL | Freq: Once | INTRAVENOUS | Status: AC | PRN
  Administered 2022-01-22: 75 mL via INTRAVENOUS

## 2022-01-22 NOTE — TOC Initial Note (Signed)
Transition of Care [facility]) - Initial/Assessment Note    Patient Details  Name: Gloria Hurst MRN: [id number] Date of Birth: 09/13/1960  [facility] CM/SW Contact:    Durenda Guthrie, RN Phone Number: 01/22/2022, 9:47 AM  Clinical Narrative:                  Transition of Care Screening Note:  Transition of Care Department [facility]) has reviewed patient and no TOC needs have been identified at this time. We will continue to monitor patient advancement through Interdisciplinary progressions. If new patient transition needs arise, please place a consult.         Patient Goals and CMS Choice        Expected Discharge Plan and Services                                                Prior Living Arrangements/Services                       Activities of Daily Living Home Assistive Devices/Equipment: Grab bars in shower, Cane (specify quad or straight) ADL Screening (condition at time of admission) Patient's cognitive ability adequate to safely complete daily activities?: Yes Is the patient deaf or have difficulty hearing?: No Does the patient have difficulty seeing, even when wearing glasses/contacts?: No Does the patient have difficulty concentrating, remembering, or making decisions?: No Patient able to express need for assistance with ADLs?: Yes Does the patient have difficulty dressing or bathing?: No Independently performs ADLs?: Yes (appropriate for developmental age) Does the patient have difficulty walking or climbing stairs?: Yes Weakness of Legs: Both Weakness of Arms/Hands: None  Permission Sought/Granted                  Emotional Assessment              Admission diagnosis:  Syncope [R55] Syncope, unspecified syncope type [R55] Patient Active Problem List   Diagnosis Date Noted   Syncope 01/18/2022   Altered mental state 01/06/2018   GERD (gastroesophageal reflux disease) 01/06/2018   Gum disease 01/06/2018    Shortness of breath 01/06/2018   Sleep apnea 01/06/2018   Left leg weakness 01/06/2018   Epigastric pain 08/31/2015   Cholecystitis 08/30/2015   Duodenitis 08/30/2015   Essential hypertension 08/30/2015   Hematochezia 08/30/2015   Hyperlipidemia 08/30/2015   Seizures (HCC) 08/30/2015   Bilateral carpal tunnel syndrome 03/24/2014   Low back pain 02/04/2014   Lumbar radiculopathy 02/04/2014   Lumbar spondylosis 02/04/2014   Right knee pain 02/04/2014   PCP:  [facility] Pharmacy:   [id number] - [place], [place] - [address] [place] [address] Phone: [phone] Fax: [phone]     Social Determinants of Health (SDOH) Interventions    Readmission Risk Interventions     No data to display

## 2022-01-22 NOTE — Progress Notes (Signed)
PROGRESS NOTE  Milica Gully  DOB: 07/13/1960  PCP: [facility] OZH:[id number]  DOA: 01/18/2022  LOS: 0 days  Hospital Day: 5  Brief narrative: Da Michelle is a 61 y.o. female with PMH significant for TIA in 2019, seizure on phenytoin, HTN, pulmonary hypertension, asthma, GERD Patient presented to the ED on 7/20 with complaint of multiple episodes of syncope. She had 2 episodes during the July 4 holiday.  At that time she was coming out of the pool and felt lightheaded and dizzy and asked a family member to hold her and then had loss of consciousness.  She then returned home and was walking up the stairs when again she felt unwell and lightheaded and had a second episode of loss of consciousness. Family felt she was confused afterwards. 2 days prior to presentation, she was walking out of a clinic and felt lightheaded.  She then got inside her car and passed out while she was there. Lightheadedness and weakness are her premonition symptoms each time.  No complaint of chest pain or palpitation. Reports compliance to medicines.  No recent changes in medications. She takes oxycodone and tizanidine for back pain but only takes this occasionally.  In the ED, patient is afebrile, heart rate in 60s, blood pressure in 160s, breathing on room air. Unremarkable CBC, BMP CT head and CT cervical spine did not show any acute intracranial process or fracture or subluxation of cervical spine D-dimer slightly elevated but CT angio chest did not show any evidence of acute PE or any other acute cardiopulmonary process. Admitted to hospitalist service  Subjective: Patient was seen and examined this morning. Sitting up in chair.  Not in distress at rest.  Patient is frustrated by recurrent dizziness.  She was seen by physical therapy earlier this morning.  She got the sensation of dizziness and blacking out on standing up and walking.  There was no orthostatic drop in blood pressure.  Assessment  and plan: Syncope -Presented with multiple episodes of syncope in last 3 weeks with premonition of lightheadedness, dizziness  -Extensive work-up was done to rule out differential diagnoses: Orthostatic syncope, cardiac arrhythmia, posterior circulation stroke -Echocardiogram with normal EF, carotid duplex with nonsignificant stenosis. -MRI brain and MRA head and neck done this morning did not show any evidence of acute infarct or large vessel occlusion.  It showed mild chronic small vessel disease.  It also showed partially empty sella turcica was congested with pseudotumor cerebri. -PT eval obtained this morning.  Recurrence of dizziness, blacking out sensation not suggestive of BPPV. -No clear Delicia for symptoms.  Neurology consultation called to Dr. Amada Jupiter. -Antivert as needed to continue.  Essential hypertension -Blood pressure elevated throughout the night to 160s  -PTA on cardizem, losartan, triamterene-HCTZ -I reduce the dose of Cardizem this morning because of bradycardia to 50s.  Continue losartan.  Continue to hold Triamterene-HCTZ. -No history of A-fib.  Was seen by cardiologist Dr. Tollie Pizza at [facility] few months ago.  Had a Zio patch in place.  Patient reports it did not show any arrhythmia.  History of seizure -Reports compliance to phenytoin.  Phenytoin level sent. -EEG unremarkable    Low back pain Continue PRN oxycodone and Tizanidine-pt reports these doses have been unchanged and she does not take it daily   Goals of care   Code Status: Full Code    Mobility: Encourage ambulation.  Skin assessment:     Nutritional status:  Body mass index is 40.24 kg/m.  Diet:  Diet Order             Diet Heart Room service appropriate? Yes; Fluid consistency: Thin  Diet effective now                   DVT prophylaxis:  enoxaparin (LOVENOX) injection 40 mg Start: 01/19/22 2200   Antimicrobials: None Fluid: None at this time Consultants:  None Family Communication: None at bedside  Status is: Observation  Continue in-hospital care because: Continues to remain dizzy on standing up.  Continue to monitor vital signs Level of care: Telemetry Medical   Dispo: The patient is from: Home              Anticipated d/c is to: Pending clinical course              Patient currently is not medically stable to d/c.   Difficult to place patient No     Infusions:    Scheduled Meds:  aspirin EC  81 mg Oral Daily   diltiazem  240 mg Oral Daily   enoxaparin (LOVENOX) injection  40 mg Subcutaneous Q24H   losartan  100 mg Oral Daily   mometasone-formoterol  2 puff Inhalation BID   montelukast  10 mg Oral QHS   pantoprazole  40 mg Oral Daily   phenytoin  400 mg Oral Daily   pneumococcal 20-valent conjugate vaccine  0.5 mL Intramuscular Tomorrow-1000   polyethylene glycol  17 g Oral Daily   rosuvastatin  20 mg Oral Daily    PRN meds: albuterol, meclizine, oxyCODONE, tiZANidine   Antimicrobials: Anti-infectives (From admission, onward)    None       Objective: Vitals:   01/22/22 0728 01/22/22 1130  BP: (!) 163/71 (!) 149/76  Pulse: (!) 52 63  Resp: 18 20  Temp: 98 F (36.7 C) 98.4 F (36.9 C)  SpO2: 97% 99%    Intake/Output Summary (Last 24 hours) at 01/22/2022 1147 Last data filed at 01/21/2022 2100 Gross per 24 hour  Intake 480 ml  Output --  Net 480 ml   Filed Weights   01/18/22 1726 01/19/22 1813  Weight: 97.5 kg 99.8 kg   Weight change:  Body mass index is 40.24 kg/m.   Physical Exam: General exam: Pleasant, middle-aged.  Not in physical distress at rest Skin: No rashes, lesions or ulcers. HEENT: Atraumatic, normocephalic, no obvious bleeding Lungs: Clear to auscultation bilaterally CVS: Sinus bradycardia, no murmur GI/Abd soft, nontender, nondistended, bowel sound present CNS: Alert, awake, oriented x3 Psychiatry: Frustrated because of persistent dizziness Extremities: No pedal edema, no calf  tenderness  Data Review: I have personally reviewed the laboratory data and studies available.  F/u labs ordered Unresulted Labs (From admission, onward)     Start     Ordered   01/18/22 1942  Phenytoin level, free and total  Once,   R        01/18/22 1942            Signed, Lorin Glass, MD [facility] 01/22/2022

## 2022-01-22 NOTE — Consult Note (Addendum)
Neurology Consultation  Reason for Consult: dizziness and syncope Referring Physician: Dr. Pola Corn  CC: dizziness and syncope  History is obtained from:patient and medical record  HPI: Khristina Janota is a 61 y.o. female with past medical history of epilepsy, TIA in 2019, HTN, HLD, sleep apnea, DM, obesity, current smoker and GERD who presents to the ED for evaluation of concerns of dizziness and syncope. She states this started on July 4th and since then has had multiple episodes of dizziness and LOC. She states she gets dizzy with LOC at random times, once when she was getting out of the pool once while walking up the steps and then walking out of the clinic.  She states she has a feeling of light headedness, and imbalance, the room does not appear to be spinning prior to these events. She states she has not had a seizure in quite some time, and her typical seizure she has full body shaking and is unresponsive, takes Dilantin, misses a dose occasionally. Family at bedside and does not report any shaking with these recent episodes. She also states she is having double vision when she looks to the left and upward, also with pain. When looking up and to the left she has slivery flashes of light. She also states when she is laying and turns her head to the left she gets light headed and dizzy. Currently she complains of a headache, light is not bothering her currently but does at times. Her headache at times is throbbing, currently it is a dull ache. Does not typically have migraines. She also states her right leg seems to be "hard to control sometimes" where it wont do what she wants it to do. No new medications added or medication adjustments. She states yesterday she was given "a little blue pill" to help with the dizziness which did not help and has only had it once. Orthostatics have been negative. MRI Brain negative, MRA no LVO. PT evaluated today, Gilberto Better was "negative to the right, but I did see  the bottom eyelid jumping slightly with L Gilberto Better testing, but no real nystagmus. During L [employer] testing she seemed to have a slightly blank gaze (eyes actually wider also) compared to how she had been the entire session and reports she felt like her brain "went to sleep". She says it was more like blacking out again and continued to deny any spinning sensations. " Neurology consulted for assistance    ROS: Full ROS was performed and is negative except as noted in the HPI.    Past Medical History:  Diagnosis Date   Acid reflux    Back pain    Diabetes mellitus without complication (HCC)    Epilepsy (HCC)    High cholesterol    HTN (hypertension)    Obesity    Sleep apnea    Stroke [facility])      Family History  Problem Relation Age of Onset   CAD Mother 37       Died at 54 of MI   CVA Father 110   Breast cancer Sister    Bladder Cancer Maternal Aunt        Died of bladder cancer     Social History:   reports that she has been smoking cigarettes. She has a 11.10 pack-year smoking history. She has never used smokeless tobacco. She reports that she does not currently use alcohol. She reports that she does not use drugs.  Medications  Current Facility-Administered Medications:  albuterol (PROVENTIL) (2.5 MG/3ML) 0.083% nebulizer solution 2.5 mg, 2.5 mg, Nebulization, Q6H PRN, Eduard Clos, MD   aspirin EC tablet 81 mg, 81 mg, Oral, Daily, Terrilee Files, MD, 81 mg at 01/22/22 1610   diltiazem (CARDIZEM CD) 24 hr capsule 240 mg, 240 mg, Oral, Daily, Dahal, Binaya, MD, 240 mg at 01/22/22 0903   enoxaparin (LOVENOX) injection 40 mg, 40 mg, Subcutaneous, Q24H, Tu, Ching T, DO, 40 mg at 01/21/22 2120   losartan (COZAAR) tablet 100 mg, 100 mg, Oral, Daily, Terrilee Files, MD, 100 mg at 01/22/22 9604   meclizine (ANTIVERT) tablet 12.5 mg, 12.5 mg, Oral, TID PRN, Lorin Glass, MD, 12.5 mg at 01/21/22 1517   mometasone-formoterol (DULERA) 100-5 MCG/ACT inhaler 2  puff, 2 puff, Inhalation, BID, Terrilee Files, MD, 2 puff at 01/22/22 0902   montelukast (SINGULAIR) tablet 10 mg, 10 mg, Oral, QHS, Terrilee Files, MD, 10 mg at 01/21/22 2119   oxyCODONE (Oxy IR/ROXICODONE) immediate release tablet 15 mg, 15 mg, Oral, QID PRN, Terrilee Files, MD, 15 mg at 01/21/22 1850   pantoprazole (PROTONIX) EC tablet 40 mg, 40 mg, Oral, Daily, Terrilee Files, MD, 40 mg at 01/22/22 5409   phenytoin (DILANTIN) ER capsule 400 mg, 400 mg, Oral, Daily, Terrilee Files, MD, 400 mg at 01/22/22 8119   pneumococcal 20-valent conjugate vaccine (PREVNAR 20) injection 0.5 mL, 0.5 mL, Intramuscular, Tomorrow-1000, [place], Meryle Ready, MD   polyethylene glycol (MIRALAX / GLYCOLAX) packet 17 g, 17 g, Oral, Daily, Dahal, Binaya, MD, 17 g at 01/22/22 0902   rosuvastatin (CRESTOR) tablet 20 mg, 20 mg, Oral, Daily, Terrilee Files, MD, 20 mg at 01/22/22 0902   tiZANidine (ZANAFLEX) tablet 2 mg, 2 mg, Oral, Q8H PRN, Tu, Ching T, DO   Exam: Current vital signs: BP (!) 149/76 (BP Location: Left Arm)   Pulse 63   Temp 98.4 F (36.9 C) (Oral)   Resp 20   Ht 5\' 2"  (1.575 m)   Wt 99.8 kg   LMP 03/26/2012   SpO2 99%   BMI 40.24 kg/m  Vital signs in last 24 hours: Temp:  [97.8 F (36.6 C)-98.7 F (37.1 C)] 98.4 F (36.9 C) (07/24 1130) Pulse Rate:  [52-67] 63 (07/24 1130) Resp:  [15-20] 20 (07/24 1130) BP: (140-176)/(65-99) 149/76 (07/24 1130) SpO2:  [97 %-100 %] 99 % (07/24 1130)  GENERAL: Awake, alert in NAD sitting in the chair eating lunch HEENT: - Normocephalic and atraumatic, dry mm LUNGS - Clear to auscultation bilaterally with no wheezes CV - S1S2 RRR, no m/r/g, equal pulses bilaterally. ABDOMEN - Soft, nontender, nondistended with normoactive BS Ext: warm, well perfused, intact peripheral pulses, no edema  NEURO:  Mental Status: AA&Ox4. Follows commands  Language: speech is clear.  Naming, repetition, fluency, and comprehension intact.Can tell a coherent  story Cranial Nerves: PERRL, EOMI there with diplopia on left gaze, visual fields full to finger count, nystagmus on leftward gaze, no facial asymmetry, facial sensation intact, hearing intact, tongue/uvula/soft palate midline, normal sternocleidomastoid and trapezius muscle strength. No evidence of tongue atrophy or fibrillations Motor: 5/5 in bilateral uppers, grip strength equal bilaterally, Right leg 4/5 with drift, left leg 5/5 Tone: is normal and bulk is normal Sensation- Intact to light touch bilaterally Coordination: FTN intact bilaterally, no ataxia in BLE. Gait- deferred   Labs I have reviewed labs in epic and the results pertinent to this consultation are:  CBC    Component Value Date/Time   WBC  10.4 01/18/2022 1804   RBC 5.10 01/18/2022 1804   HGB 13.7 01/18/2022 1804   HCT 41.7 01/18/2022 1804   PLT 254 01/18/2022 1804   MCV 81.8 01/18/2022 1804   MCH 26.9 01/18/2022 1804   MCHC 32.9 01/18/2022 1804   RDW 15.2 01/18/2022 1804   LYMPHSABS 2.8 01/18/2022 1804   MONOABS 0.8 01/18/2022 1804   EOSABS 0.4 01/18/2022 1804   BASOSABS 0.1 01/18/2022 1804    CMP     Component Value Date/Time   NA 134 (L) 01/20/2022 0651   K 3.7 01/20/2022 0651   CL 101 01/20/2022 0651   CO2 24 01/20/2022 0651   GLUCOSE 105 (H) 01/20/2022 0651   BUN 10 01/20/2022 0651   CREATININE 0.84 01/20/2022 0651   CALCIUM 8.7 (L) 01/20/2022 0651   PROT 6.4 (L) 01/20/2022 0651   ALBUMIN 3.5 01/20/2022 0651   AST 16 01/20/2022 0651   ALT 21 01/20/2022 0651   ALKPHOS 63 01/20/2022 0651   BILITOT 0.6 01/20/2022 0651   GFRNONAA >60 01/20/2022 0651   GFRAA >60 03/03/2020 1240    Lipid Panel     Component Value Date/Time   CHOL 172 01/07/2018 0339   TRIG 132 01/07/2018 0339   HDL 46 01/07/2018 0339   CHOLHDL 3.7 01/07/2018 0339   VLDL 26 01/07/2018 0339   LDLCALC 100 (H) 01/07/2018 0339     Imaging I have reviewed the images obtained:  CT-head 7/20 no acute process  MRI examination  of the brain 1. Significantly motion degraded examination, limiting evaluation. 2. No evidence of acute infarct. 3. Mild chronic small vessel image changes within the cerebral white matter. 4. Partially empty sella turcica. While this finding often reflects incidental anatomic variation, it can also be associated with idiopathic intracranial hypertension (pseudotumor cerebri). 5. Paranasal sinus disease, as described. 6. Small-volume fluid within the left mastoid air cells.   MRA head: 1. Mild to moderately motion degraded exam. 2. No intracranial large vessel occlusion or proximal high-grade arterial stenosis.  MRA neck: -Mild to moderately motion degraded exam. -The common carotid and internal carotid arteries are patent within the neck without hemodynamically significant stenosis. -The vertebral arteries are patent within the neck. There are sites of apparent high-grade stenosis within the proximal V1 segments, bilaterally. However, these apparent stenoses could potentially be exaggerated by vessel tortuosity and motion degradation.  ECHO: EF 60-65% Carotid US:  Right Carotid: Velocities in the right ICA are consistent with a 1-39%  stenosis.  Left Carotid: Velocities in the left ICA are consistent with a 1-39%  stenosis.  Vertebrals:  Bilateral vertebral arteries demonstrate antegrade flow.  Subclavians: Normal flow hemodynamics were seen in bilateral subclavian arteries.   rEEG 7/21: This study is within normal limits. No seizures or epileptiform discharges were seen throughout the recording.  VS HR 52-67, BP 140-176/65-99 Phenytoin level pending   Attending Attestation: I have seen the patient and reviewed the above note.  When I have her look to the left she does have diplopia which is side-by-side.  Moving her head without her eyes deviating does not cause her any symptoms, so I think this is diplopia exacerbating her dizziness rather than some type of positional  vertebral artery issue.  Assessment:  Lillyahna Hemberger is a 61 y.o. female with past medical history of epilepsy, TIA in 2019, HTN, HLD, sleep apnea, DM, obesity, current smoker and GERD who presents to the ED for evaluation of dizziness and syncope.  From description she also has headache  that is exacerbated with leaning over and has noticeable on MRI.  She also has bilateral vertebral stenosis on MRA.   She did not appear significantly orthostatic, but I do wonder about vertebral stenosis as an etiology of her symptoms.  If that is the case, given the persistent diplopia I would expect a small brainstem infarct.  Another consideration that could explain her symptoms would be pseudotumor cerebri.  I would begin with evaluation with CTA to evaluate the true degree of stenosis of the vertebral arteries, as well as repeating thin cut diffusions through the brainstem.  If these are negative or ambiguous then we will pursue an LP tomorrow to check opening pressure.  Recommendations: 1) MRI brain with thin cuts through the brainstem 2) CTA head and neck 3) we will consider LP tomorrow if no explanation on the above studies  Ritta Slot, MD [facility] [phone]  If 7pm- 7am, please page neurology on call as listed in AMION.

## 2022-01-22 NOTE — Evaluation (Signed)
Physical Therapy Vestibular Evaluation Patient Details Name: Gloria Hurst MRN: [id number] DOB: 05/01/1961 Today's Date: 01/22/2022  History of Present Illness  Pt is a 61 y.o. female who presented 01/18/22 with concerns of multiple episodes of syncope. CT and MRI of head were negative. CT of cervical spine was negative. No evidence of PE. 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 PMH: TIA in 2019, seizure on phenytoin, HTN, pulmonary hypertension, asthma, GERD   Clinical Impression  Pt presents with condition above and deficits mentioned below, see PT Problem List. Pt reported she tripped and fell ~1 month ago and is unsure if she hit her head or not. She reports these syncope episodes began after that, but not sure how long after (she guessed ~1 week after the fall). She had not receive meclizine since yesterday afternoon, which she reported did not seem to help relieve her symptoms, and therefore should not have impacted our vestibular eval. She describes her symptoms are more like "blacking out" with orthostatics reading 159/69 supine, 162/70 sitting, 170/121 standing, 148/85 standing ~3 min. Symptoms did get worse as standing time increased and reported double vision at one point when walking. She tends to hold her neck in extension and when cued to tuck her chin down she reports this helped the double vision. Marye Round was negative to R but I did see the bottom eyelid jumping slightly with L Marye Round testing, but no real nystagmus. During L [employer] testing she seemed to have a slightly blank gaze (eyes actually wider also) compared to how she had been the entire session and reports she felt like her brain "went to sleep". She says it was more like blacking out again and continued to deny any spinning sensations. Her symptoms do not appear to be vestibular related, but question whether the vertebral artery (the L one in particular) could be impacted and  contributing to her symptoms as she did not have nystagmus or spinning sensations but more like passing out/lightheadedness/double vision with neck extension. Notified RN and MD of these findings. Deferred further positioning/testing in this position for pt safety. Pt is requiring min guard assist when using a RW for mobility. Pt displays deficits in strength (R leg weaker from prior CVA per pt), balance, and activity tolerance. She is at risk for subsequent falls and would benefit from further acute PT and HHPT follow-up to maximize her return to baseline. At baseline she is IND, working in Education administrator houses, and living with her significant other in a 1-level house with 5-6 STE.     Recommendations for follow up therapy are one component of a multi-disciplinary discharge planning process, led by the attending physician.  Recommendations may be updated based on patient status, additional functional criteria and insurance authorization.  Follow Up Recommendations Home health PT      Assistance Recommended at Discharge Intermittent Supervision/Assistance  Patient can return home with the following  A little help with bathing/dressing/bathroom;Assistance with cooking/housework;Assist for transportation;Help with stairs or ramp for entrance    Equipment Recommendations Rolling walker (2 wheels);BSC/3in1  Recommendations for Other Services       Functional Status Assessment Patient has had a recent decline in their functional status and demonstrates the ability to make significant improvements in function in a reasonable and predictable amount of time.     Precautions / Restrictions Precautions Precautions: Fall Precaution Comments: vertebral artery issues?? Restrictions Weight Bearing Restrictions: No      Mobility  Bed Mobility Overal  bed mobility: Needs Assistance Bed Mobility: Supine to Sit     Supine to sit: Supervision, HOB elevated     General bed mobility comments: Supervision  for safety, HOB elevated    Transfers Overall transfer level: Needs assistance Equipment used: Rolling walker (2 wheels), None Transfers: Sit to/from Stand Sit to Stand: Min guard           General transfer comment: Min guard to come to stand 2x without AD and 1x with RW, cuing for hand positioning.    Ambulation/Gait Ambulation/Gait assistance: Min guard Gait Distance (Feet): 95 Feet Assistive device: Rolling walker (2 wheels) Gait Pattern/deviations: Step-through pattern, Decreased stride length Gait velocity: reduced Gait velocity interpretation: <1.31 ft/sec, indicative of household ambulator   General Gait Details: Pt with slow, but fairly steady gait when using RW. Reported double vision of hallway when got to ~20 ft out in the hall, noted pt holding neck in extension. Cued pt to tuck chin, which pt reported improved her double vision. No LOB, min guard for safety  Stairs            Wheelchair Mobility    Modified Rankin (Stroke Patients Only) Modified Rankin (Stroke Patients Only) Pre-Morbid Rankin Score: No significant disability Modified Rankin: Moderately severe disability     Balance Overall balance assessment: Needs assistance Sitting-balance support: No upper extremity supported, Feet supported, Bilateral upper extremity supported Sitting balance-Leahy Scale: Fair Sitting balance - Comments: Able to stand without UE support, but as time in standing increased so did her trunk sway, needing min guard initially then minA to prevent LOB, benefits from RW for mobility   Standing balance support: No upper extremity supported, During functional activity Standing balance-Leahy Scale: Good Standing balance comment: Static sitting EOB with supervision for safety                             Pertinent Vitals/Pain Pain Assessment Pain Assessment: Faces Faces Pain Scale: Hurts little more Pain Location: chronic back pain Pain Descriptors /  Indicators: Grimacing, Discomfort Pain Intervention(s): Limited activity within patient's tolerance, Monitored during session, Repositioned    Home Living Family/patient expects to be discharged to:: Private residence Living Arrangements: Spouse/significant other Available Help at Discharge: Family;Available PRN/intermittently Type of Home: House Home Access: Stairs to enter Entrance Stairs-Rails:  (no rails on 6 steps on side; one rail "in middle" on 5 steps in front) Entrance Stairs-Number of Steps: 5-6   Home Layout: One level Home Equipment: Cane - single point;Rolling Walker (2 wheels);Grab bars - tub/shower      Prior Function Prior Level of Function : Independent/Modified Independent;Working/employed;Driving             Mobility Comments: x3 falls in past 6 months, first fall she tripped ~1 month prior and not sure if she hit her head and then had syncope episodes after that resulting in other 2 falls. Syncope episodes began ~1 week after first fall. ADLs Comments: Cleans houses     Hand Dominance   Dominant Hand: Left    Extremity/Trunk Assessment   Upper Extremity Assessment Upper Extremity Assessment: Overall WFL for tasks assessed    Lower Extremity Assessment Lower Extremity Assessment: RLE deficits/detail RLE Deficits / Details: Slight weakness noted compared to L with pt reporting hx of CVA affecting R side    Cervical / Trunk Assessment Cervical / Trunk Assessment: Other exceptions;Kyphotic Cervical / Trunk Exceptions: chronic back pain  Communication   Communication:  No difficulties  Cognition Arousal/Alertness: Awake/alert Behavior During Therapy: WFL for tasks assessed/performed Overall Cognitive Status: Within Functional Limits for tasks assessed                                 General Comments: Follows commands appropriately. Tends to close eyes when feeling lightheaded, needs reminders to keep eyes open        General  Comments General comments (skin integrity, edema, etc.): 159/69 supine, 162/70 sitting, 170/121 standing, 148/85 standing ~3 min; Dix Hallpike was negative to R but I did see the bottom eyelid jumping slightly with L [employer] testing, but no real nystagmus. During L [employer] testing she seemed to have a slightly blank gaze (eyes actually wider also) compared to how she had been the entire session and reports she felt like her brain "went to sleep". She says it was more like blacking out again and continued to deny any spinning sensations; notified RN and MD of findings    Exercises     Assessment/Plan    PT Assessment Patient needs continued PT services  PT Problem List Decreased strength;Decreased activity tolerance;Decreased mobility;Decreased balance       PT Treatment Interventions DME instruction;Gait training;Stair training;Functional mobility training;Therapeutic activities;Therapeutic exercise;Balance training;Neuromuscular re-education;Patient/family education    PT Goals (Current goals can be found in the Care Plan section)  Acute Rehab PT Goals Patient Stated Goal: to get better PT Goal Formulation: With patient Time For Goal Achievement: 02/05/22 Potential to Achieve Goals: Good    Frequency Min 3X/week     Co-evaluation               AM-PAC PT "6 Clicks" Mobility  Outcome Measure Help needed turning from your back to your side while in a flat bed without using bedrails?: A Little Help needed moving from lying on your back to sitting on the side of a flat bed without using bedrails?: A Little Help needed moving to and from a bed to a chair (including a wheelchair)?: A Little Help needed standing up from a chair using your arms (e.g., wheelchair or bedside chair)?: A Little Help needed to walk in hospital room?: A Little Help needed climbing 3-5 steps with a railing? : A Little 6 Click Score: 18    End of Session Equipment Utilized During Treatment: Gait  belt Activity Tolerance: Patient tolerated treatment well Patient left: in chair;with call bell/phone within reach;with chair alarm set Nurse Communication: Mobility status;Other (comment) (findings of eval) PT Visit Diagnosis: Unsteadiness on feet (R26.81);Other abnormalities of gait and mobility (R26.89);Muscle weakness (generalized) (M62.81);Difficulty in walking, not elsewhere classified (R26.2);History of falling (Z91.81)    Time: 0909-1000 PT Time Calculation (min) (ACUTE ONLY): 51 min   Charges:   PT Evaluation $PT Eval Moderate Complexity: 1 Mod PT Treatments $Gait Training: 8-22 mins $Therapeutic Activity: 8-22 mins        Raymond Gurney, PT, DPT Acute Rehabilitation Services  Office: [phone]   Jewel Baize 01/22/2022, 10:33 AM

## 2022-01-23 ENCOUNTER — Inpatient Hospital Stay (HOSPITAL_COMMUNITY): Payer: Medicare Other

## 2022-01-23 ENCOUNTER — Observation Stay (HOSPITAL_COMMUNITY): Payer: Medicare Other

## 2022-01-23 DIAGNOSIS — Z9049 Acquired absence of other specified parts of digestive tract: Secondary | ICD-10-CM | POA: Diagnosis not present

## 2022-01-23 DIAGNOSIS — G932 Benign intracranial hypertension: Secondary | ICD-10-CM | POA: Diagnosis present

## 2022-01-23 DIAGNOSIS — Z6841 Body Mass Index (BMI) 40.0 and over, adult: Secondary | ICD-10-CM | POA: Diagnosis not present

## 2022-01-23 DIAGNOSIS — F17210 Nicotine dependence, cigarettes, uncomplicated: Secondary | ICD-10-CM | POA: Diagnosis present

## 2022-01-23 DIAGNOSIS — Z8249 Family history of ischemic heart disease and other diseases of the circulatory system: Secondary | ICD-10-CM | POA: Diagnosis not present

## 2022-01-23 DIAGNOSIS — R42 Dizziness and giddiness: Secondary | ICD-10-CM

## 2022-01-23 DIAGNOSIS — R55 Syncope and collapse: Secondary | ICD-10-CM | POA: Diagnosis present

## 2022-01-23 DIAGNOSIS — Z9102 Food additives allergy status: Secondary | ICD-10-CM | POA: Diagnosis not present

## 2022-01-23 DIAGNOSIS — H532 Diplopia: Secondary | ICD-10-CM | POA: Diagnosis present

## 2022-01-23 DIAGNOSIS — Z8673 Personal history of transient ischemic attack (TIA), and cerebral infarction without residual deficits: Secondary | ICD-10-CM | POA: Diagnosis not present

## 2022-01-23 DIAGNOSIS — Z79890 Hormone replacement therapy (postmenopausal): Secondary | ICD-10-CM | POA: Diagnosis not present

## 2022-01-23 DIAGNOSIS — G40909 Epilepsy, unspecified, not intractable, without status epilepticus: Secondary | ICD-10-CM | POA: Diagnosis present

## 2022-01-23 DIAGNOSIS — Z7982 Long term (current) use of aspirin: Secondary | ICD-10-CM | POA: Diagnosis not present

## 2022-01-23 DIAGNOSIS — Z91030 Bee allergy status: Secondary | ICD-10-CM | POA: Diagnosis not present

## 2022-01-23 DIAGNOSIS — E119 Type 2 diabetes mellitus without complications: Secondary | ICD-10-CM | POA: Diagnosis present

## 2022-01-23 DIAGNOSIS — Z79899 Other long term (current) drug therapy: Secondary | ICD-10-CM | POA: Diagnosis not present

## 2022-01-23 DIAGNOSIS — Z880 Allergy status to penicillin: Secondary | ICD-10-CM | POA: Diagnosis not present

## 2022-01-23 DIAGNOSIS — J45909 Unspecified asthma, uncomplicated: Secondary | ICD-10-CM | POA: Diagnosis present

## 2022-01-23 DIAGNOSIS — E7800 Pure hypercholesterolemia, unspecified: Secondary | ICD-10-CM | POA: Diagnosis present

## 2022-01-23 DIAGNOSIS — G4730 Sleep apnea, unspecified: Secondary | ICD-10-CM | POA: Diagnosis present

## 2022-01-23 DIAGNOSIS — K219 Gastro-esophageal reflux disease without esophagitis: Secondary | ICD-10-CM | POA: Diagnosis present

## 2022-01-23 DIAGNOSIS — Z91199 Patient's noncompliance with other medical treatment and regimen due to unspecified reason: Secondary | ICD-10-CM | POA: Diagnosis not present

## 2022-01-23 DIAGNOSIS — M545 Low back pain, unspecified: Secondary | ICD-10-CM | POA: Diagnosis present

## 2022-01-23 DIAGNOSIS — I10 Essential (primary) hypertension: Secondary | ICD-10-CM | POA: Diagnosis present

## 2022-01-23 MED ORDER — ALPRAZOLAM 0.5 MG PO TABS
0.5000 mg | ORAL_TABLET | Freq: Once | ORAL | Status: AC
Start: 2022-01-23 — End: 2022-01-23
  Administered 2022-01-23: 0.5 mg via ORAL
  Filled 2022-01-23: qty 1

## 2022-01-23 NOTE — Progress Notes (Addendum)
Neurology Progress Note   S:// Patient sitting up in bed in NAD. She is still experiencing symptoms of dizziness and blurred vision but states she feels it has improved a little. I did assist patient ambulating to bathroom, she is slightly unsteady due to mild dizziness. Informed of the negative results from Brain MRI and CT scan. Discussed options of pursuing LP due to ongoing symptoms and she is unsure if she wants to precede with it. Would like me to call her daughter Porfirio Mylar and discuss it. Spoke with [place] via phone, relayed results of brain imaging and reasons for pursuing LP. Also discussed the complications related to LP procedure. She voiced understanding and stated she would speak to her Mom.    O:// Current vital signs: BP (!) 153/73 (BP Location: Right Arm)   Pulse 62   Temp 98.1 F (36.7 C) (Oral)   Resp 17   Ht 5\' 2"  (1.575 m)   Wt 99.8 kg   LMP 03/26/2012   SpO2 99%   BMI 40.24 kg/m  Vital signs in last 24 hours: Temp:  [98.1 F (36.7 C)-98.6 F (37 C)] 98.1 F (36.7 C) (07/25 0827) Pulse Rate:  [59-68] 62 (07/25 0827) Resp:  [16-20] 17 (07/25 0827) BP: (144-170)/(73-96) 153/73 (07/25 0827) SpO2:  [96 %-99 %] 99 % (07/25 0827)  GENERAL: Awake, alert sitting up in bed in NAD HEENT: - Normocephalic and atraumatic, dry mm LUNGS - Clear to auscultation bilaterally with no wheezes CV - S1S2 RRR, no m/r/g, equal pulses bilaterally. ABDOMEN - Soft, nontender, nondistended with normoactive BS Ext: warm, well perfused, intact peripheral pulses, no edema  NEURO:  Mental Status: AA&Ox4. Following commands Language: speech is clear.  Naming, repetition, fluency, and comprehension intact. Cranial Nerves: PERRL, EOMI, diplopia on left gaze, visual fields full, mild nystagmus on leftward gaze, no facial asymmetry, facial sensation intact, hearing intact, tongue/uvula/soft palate midline, normal sternocleidomastoid and trapezius muscle strength. No evidence of tongue atrophy or  fibrillations Motor: 5/5 in all 4 extremities, right and left leg 5/5 Tone: is normal and bulk is normal Sensation- Intact to light touch bilaterally Coordination: FTN intact bilaterally, no ataxia in BLE. Gait- deferred    Medications  Current Facility-Administered Medications:    albuterol (PROVENTIL) (2.5 MG/3ML) 0.083% nebulizer solution 2.5 mg, 2.5 mg, Nebulization, Q6H PRN, Eduard Clos, MD   aspirin EC tablet 81 mg, 81 mg, Oral, Daily, Terrilee Files, MD, 81 mg at 01/23/22 0820   diltiazem (CARDIZEM CD) 24 hr capsule 240 mg, 240 mg, Oral, Daily, Dahal, Binaya, MD, 240 mg at 01/23/22 0821   enoxaparin (LOVENOX) injection 40 mg, 40 mg, Subcutaneous, Q24H, Tu, Ching T, DO, 40 mg at 01/22/22 2100   losartan (COZAAR) tablet 100 mg, 100 mg, Oral, Daily, Terrilee Files, MD, 100 mg at 01/23/22 4098   meclizine (ANTIVERT) tablet 12.5 mg, 12.5 mg, Oral, TID PRN, Dahal, Melina Schools, MD, 12.5 mg at 01/21/22 1517   mometasone-formoterol (DULERA) 100-5 MCG/ACT inhaler 2 puff, 2 puff, Inhalation, BID, Terrilee Files, MD, 2 puff at 01/23/22 0822   montelukast (SINGULAIR) tablet 10 mg, 10 mg, Oral, QHS, Terrilee Files, MD, 10 mg at 01/22/22 2100   oxyCODONE (Oxy IR/ROXICODONE) immediate release tablet 15 mg, 15 mg, Oral, QID PRN, Terrilee Files, MD, 15 mg at 01/23/22 0821   pantoprazole (PROTONIX) EC tablet 40 mg, 40 mg, Oral, Daily, Terrilee Files, MD, 40 mg at 01/23/22 1191   phenytoin (DILANTIN) ER capsule 400 mg, 400  mg, Oral, Daily, Terrilee Files, MD, 400 mg at 01/23/22 5784   pneumococcal 20-valent conjugate vaccine (PREVNAR 20) injection 0.5 mL, 0.5 mL, Intramuscular, Tomorrow-1000, Eduard Clos, MD   polyethylene glycol (MIRALAX / GLYCOLAX) packet 17 g, 17 g, Oral, Daily, Dahal, Binaya, MD, 17 g at 01/23/22 6962   rosuvastatin (CRESTOR) tablet 20 mg, 20 mg, Oral, Daily, Terrilee Files, MD, 20 mg at 01/23/22 0820   tiZANidine (ZANAFLEX) tablet 2 mg, 2 mg,  Oral, Q8H PRN, Anselm Jungling, DO Labs CBC    Component Value Date/Time   WBC 10.4 01/18/2022 1804   RBC 5.10 01/18/2022 1804   HGB 13.7 01/18/2022 1804   HCT 41.7 01/18/2022 1804   PLT 254 01/18/2022 1804   MCV 81.8 01/18/2022 1804   MCH 26.9 01/18/2022 1804   MCHC 32.9 01/18/2022 1804   RDW 15.2 01/18/2022 1804   LYMPHSABS 2.8 01/18/2022 1804   MONOABS 0.8 01/18/2022 1804   EOSABS 0.4 01/18/2022 1804   BASOSABS 0.1 01/18/2022 1804    CMP     Component Value Date/Time   NA 134 (L) 01/20/2022 0651   K 3.7 01/20/2022 0651   CL 101 01/20/2022 0651   CO2 24 01/20/2022 0651   GLUCOSE 105 (H) 01/20/2022 0651   BUN 10 01/20/2022 0651   CREATININE 0.84 01/20/2022 0651   CALCIUM 8.7 (L) 01/20/2022 0651   PROT 6.4 (L) 01/20/2022 0651   ALBUMIN 3.5 01/20/2022 0651   AST 16 01/20/2022 0651   ALT 21 01/20/2022 0651   ALKPHOS 63 01/20/2022 0651   BILITOT 0.6 01/20/2022 0651   GFRNONAA >60 01/20/2022 0651   GFRAA >60 03/03/2020 1240    glycosylated hemoglobin  Lipid Panel     Component Value Date/Time   CHOL 172 01/07/2018 0339   TRIG 132 01/07/2018 0339   HDL 46 01/07/2018 0339   CHOLHDL 3.7 01/07/2018 0339   VLDL 26 01/07/2018 0339   LDLCALC 100 (H) 01/07/2018 0339     Imaging I have reviewed images in epic and the results pertinent to this consultation are:  CT-head 7/20 no acute process   MRI examination of the brain 1. Significantly motion degraded examination, limiting evaluation. 2. No evidence of acute infarct. 3. Mild chronic small vessel image changes within the cerebral white matter. 4. Partially empty sella turcica. While this finding often reflects incidental anatomic variation, it can also be associated with idiopathic intracranial hypertension (pseudotumor cerebri). 5. Paranasal sinus disease, as described. 6. Small-volume fluid within the left mastoid air cells.   MRA head: 1. Mild to moderately motion degraded exam. 2. No intracranial large  vessel occlusion or proximal high-grade arterial stenosis.   MRA neck: -Mild to moderately motion degraded exam. -The common carotid and internal carotid arteries are patent within the neck without hemodynamically significant stenosis. -The vertebral arteries are patent within the neck. There are sites of apparent high-grade stenosis within the proximal V1 segments, bilaterally. However, these apparent stenoses could potentially be exaggerated by vessel tortuosity and motion degradation.   CTA head/neck: 1. Negative CTA of the head and neck. No large vessel occlusion or other emergent finding. 2. Mild-to-moderate atheromatous irregularity about the carotid bifurcations/proximal ICAs without significant stenosis. No hemodynamically significant or correctable stenosis about the major arterial vasculature of the head and neck.   Repeat MRI brain 7/25: 1. No acute intracranial abnormality. 2. Mild chronic microvascular ischemic disease for age.   ECHO: EF 60-65% Carotid US:  Right Carotid: Velocities in the right  ICA are consistent with a 1-39%  stenosis.  Left Carotid: Velocities in the left ICA are consistent with a 1-39%  stenosis.  Vertebrals:  Bilateral vertebral arteries demonstrate antegrade flow.  Subclavians: Normal flow hemodynamics were seen in bilateral subclavian arteries.    rEEG 7/21: This study is within normal limits. No seizures or epileptiform discharges were seen throughout the recording.  Assessment:  Toshi Ishii is a 61 y.o. female with past medical history of epilepsy, TIA in 2019, HTN, HLD, sleep apnea, DM, obesity, current smoker and GERD who presents to the ED for evaluation of concerns of dizziness and syncope. She states this started on July 4th and since then has had multiple episodes of dizziness and LOC.   No evidence for SVT on CTA. Pseudotumor can present with the symptoms she is describing, and I think an LP to rule out IIH would be prudent.    Recommendations: - Hold Lovenox for LP - Possible LP today to evaluate opening pressure  Gevena Mart DNP, ACNPC-AG   I have seen the patient reviewed the above note.  She continues to have diplopia on leftward gaze.  Pseudotumor can sometimes present with a constellation of symptoms that she describes and therefore I think an LP would be prudent.  Her venous sinuses are well visualized on CTA and are negative.  Her symptoms are orthostatic in nature, though her vital signs did not reveal orthostasis, but if she continues to be symptomatic with no other explanation would consider trialing support hose, etc.  LP was attempted at bedside, but was unsuccessful and therefore will need to be done under fluoroscopy.  Ritta Slot, MD [facility] [phone]  If 7pm- 7am, please page neurology on call as listed in [facility].

## 2022-01-23 NOTE — Progress Notes (Signed)
Physical Therapy Treatment Patient Details Name: Gloria Hurst MRN: [id number] DOB: 03/28/1961 Today's Date: 01/23/2022   History of Present Illness Pt is a 61 y.o. female who presented 01/18/22 with concerns of multiple episodes of syncope. CT and MRI of head were negative. CT of cervical spine was negative. No evidence of PE. 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 PMH: TIA in 2019, seizure on phenytoin, HTN, pulmonary hypertension, asthma, GERD    PT Comments    Pt with improved ambulation tolerance. Pt continues to hold head in extension with eyes minimal open stating "that way I don't have double vision". Pt reports being "swimmy headed" however no lateral sway or knee instability during ambulation with RW. Pt is unsteady however without RW. Acute PT to cont to follow and re-assess s/p LP.    Recommendations for follow up therapy are one component of a multi-disciplinary discharge planning process, led by the attending physician.  Recommendations may be updated based on patient status, additional functional criteria and insurance authorization.  Follow Up Recommendations  Home health PT     Assistance Recommended at Discharge Intermittent Supervision/Assistance  Patient can return home with the following A little help with bathing/dressing/bathroom;Assistance with cooking/housework;Assist for transportation;Help with stairs or ramp for entrance   Equipment Recommendations  Rolling walker (2 wheels);BSC/3in1    Recommendations for Other Services       Precautions / Restrictions Precautions Precautions: Fall Precaution Comments: vertebral artery issues?? Restrictions Weight Bearing Restrictions: No     Mobility  Bed Mobility Overal bed mobility: Needs Assistance Bed Mobility: Supine to Sit     Supine to sit: Supervision, HOB elevated     General bed mobility comments: Supervision for safety, HOB elevated    Transfers Overall  transfer level: Needs assistance Equipment used: Rolling walker (2 wheels), None Transfers: Sit to/from Stand Sit to Stand: Min guard           General transfer comment: min guard, verbal cues for hand placement/not to pull up on walker, pt with report of dizziness upon standing, pt keeping eyes minimally open t/o session    Ambulation/Gait Ambulation/Gait assistance: Min guard Gait Distance (Feet): 100 Feet Assistive device: Rolling walker (2 wheels) Gait Pattern/deviations: Step-through pattern, Decreased stride length Gait velocity: reduced Gait velocity interpretation: <1.31 ft/sec, indicative of household ambulator   General Gait Details: Pt with slow, but fairly steady gait when using RW. pt reporting "swimmy headed, spinning" however no lateral sway or knee buckling or instability with amb. Pt holding head in extension, verbal cues to look straight ahead. pt with 1 standing rest break at 50' due to dizziness however stated "Im going to walk to the RN station" and was then able to do so. instructed pt to look forward at exit sign (non-moving object)   Stairs             Wheelchair Mobility    Modified Rankin (Stroke Patients Only) Modified Rankin (Stroke Patients Only) Pre-Morbid Rankin Score: No significant disability Modified Rankin: Moderately severe disability     Balance Overall balance assessment: Needs assistance Sitting-balance support: No upper extremity supported, Feet supported, Bilateral upper extremity supported Sitting balance-Leahy Scale: Fair Sitting balance - Comments: Able to stand without UE support, but as time in standing increased so did her trunk sway, needing min guard initially then minA to prevent LOB, benefits from RW for mobility   Standing balance support: No upper extremity supported, During functional activity Standing balance-Leahy Scale: Good Standing  balance comment: Static sitting EOB with supervision for safety                             Cognition Arousal/Alertness: Awake/alert Behavior During Therapy: WFL for tasks assessed/performed Overall Cognitive Status: Within Functional Limits for tasks assessed                                 General Comments: Follows commands appropriately. Tends to close eyes when feeling lightheaded, needs reminders to keep eyes open        Exercises      General Comments General comments (skin integrity, edema, etc.): VSS, pt reports double vision at times and states she squints so she doesn' thave double vision, VSS      Pertinent Vitals/Pain Pain Assessment Pain Assessment: No/denies pain    Home Living                          Prior Function            PT Goals (current goals can now be found in the care plan section) Acute Rehab PT Goals Patient Stated Goal: to get better PT Goal Formulation: With patient Time For Goal Achievement: 02/05/22 Potential to Achieve Goals: Good Progress towards PT goals: Progressing toward goals    Frequency    Min 3X/week      PT Plan      Co-evaluation              AM-PAC PT "6 Clicks" Mobility   Outcome Measure  Help needed turning from your back to your side while in a flat bed without using bedrails?: A Little Help needed moving from lying on your back to sitting on the side of a flat bed without using bedrails?: A Little Help needed moving to and from a bed to a chair (including a wheelchair)?: A Little Help needed standing up from a chair using your arms (e.g., wheelchair or bedside chair)?: A Little Help needed to walk in hospital room?: A Little Help needed climbing 3-5 steps with a railing? : A Little 6 Click Score: 18    End of Session Equipment Utilized During Treatment: Gait belt Activity Tolerance: Patient tolerated treatment well Patient left: in chair;with call bell/phone within reach;with chair alarm set;with family/visitor present Nurse Communication:  Mobility status;Other (comment) (findings of eval) PT Visit Diagnosis: Unsteadiness on feet (R26.81);Other abnormalities of gait and mobility (R26.89);Muscle weakness (generalized) (M62.81);Difficulty in walking, not elsewhere classified (R26.2);History of falling (Z91.81)     Time: 6270-3500 PT Time Calculation (min) (ACUTE ONLY): 24 min  Charges:  $Gait Training: 23-37 mins                     Lewis Shock, PT, DPT Acute Rehabilitation Services Secure chat preferred Office #: [phone]    Iona Hansen 01/23/2022, 2:15 PM

## 2022-01-23 NOTE — Procedures (Signed)
Indication: Headache  Risks of the procedure were dicussed with the patient including post-LP headache, bleeding, infection, weakness/numbness of legs(radiculopathy), death.  The patient/patient's proxy agreed and written consent was obtained.   The patient was prepped and draped, and using sterile technique a 20 gauge quinke spinal needle was inserted in the L4-5 space. Unfortunately, bony resistance was repeatedly met and therefore the procedure was aborted.   Will arrange to have done under fluoroscopy.   Roland Rack, MD [facility] [phone]  If 7pm- 7am, please page neurology on call as listed in [place].

## 2022-01-23 NOTE — Progress Notes (Signed)
PROGRESS NOTE  Gloria Hurst  DOB: Jul 26, 1960  PCP: [facility] QVZ:[id number]  DOA: 01/18/2022  LOS: 0 days  Hospital Day: 6  Brief narrative: Gloria Hurst is a 61 y.o. female with PMH significant for TIA in 2019, seizure on phenytoin, HTN, pulmonary hypertension, asthma, GERD Patient presented to the ED on 7/20 with complaint of multiple episodes of syncope. She had 2 episodes during the July 4 holiday.  At that time she was coming out of the pool and felt lightheaded and dizzy and asked a family member to hold her and then had loss of consciousness.  She then returned home and was walking up the stairs when again she felt unwell and lightheaded and had a second episode of loss of consciousness. Family felt she was confused afterwards. 2 days prior to presentation, she was walking out of a clinic and felt lightheaded.  She then got inside her car and passed out while she was there. Lightheadedness and weakness are her premonition symptoms each time.  No complaint of chest pain or palpitation. Reports compliance to medicines.  No recent changes in medications. She takes oxycodone and tizanidine for back pain but only takes this occasionally.  In the ED, patient is afebrile, heart rate in 60s, blood pressure in 160s, breathing on room air. Unremarkable CBC, BMP CT head and CT cervical spine did not show any acute intracranial process or fracture or subluxation of cervical spine D-dimer slightly elevated but CT angio chest did not show any evidence of acute PE or any other acute cardiopulmonary process. Admitted to hospitalist service  Subjective: Patient was seen and examined this morning. Propped up in bed.  Not in distress.  Continues to be dizzy on standing up.  Family member at bedside. Discussed with neurology.  Noted a plan to do lumbar puncture this afternoon.  Assessment and plan: Syncope -Presented with multiple episodes of syncope in last 3 weeks with premonition  of lightheadedness, dizziness  -Extensive work-up was done to rule out differential diagnoses: Orthostatic syncope, cardiac arrhythmia, posterior circulation stroke -Echocardiogram with normal EF, carotid duplex with nonsignificant stenosis. -MRI brain and MRA head and neck done this morning did not show any evidence of acute infarct or large vessel occlusion.  It showed mild chronic small vessel disease.  It also showed partially empty sella turcica was suggestive of pseudotumor cerebri. -Neurology consulted.  Per neurology, the constellation of her symptoms are likely suggestive of pseudotumor cerebri. -Noted a plan to do lumbar puncture this afternoon.  Essential hypertension -Blood pressure elevated throughout the night to 160s  -PTA on cardizem, losartan, triamterene-HCTZ -I reduce the dose of Cardizem this morning because of bradycardia to 50s.  Continue losartan.  Continue to hold Triamterene-HCTZ. -No history of A-fib.  Was seen by cardiologist Dr. Tollie Pizza at [facility] few months ago.  Had a Zio patch in place.  Patient reports it did not show any arrhythmia.  History of seizure -Reports compliance to phenytoin. EEG unremarkable -Phenytoin level sent on admission was reported as 'none detected'    Low back pain Continue PRN oxycodone and Tizanidine-pt reports these doses have been unchanged and she does not take it daily   Goals of care   Code Status: Full Code    Mobility: Encourage ambulation.  Skin assessment:     Nutritional status:  Body mass index is 40.24 kg/m.          Diet:  Diet Order  Diet Heart Room service appropriate? Yes; Fluid consistency: Thin  Diet effective now                   DVT prophylaxis:  enoxaparin (LOVENOX) injection 40 mg Start: 01/19/22 2200   Antimicrobials: None Fluid: None at this time Consultants: None Family Communication: None at bedside  Status is: Inpatient  Continue in-hospital care because:  Continues to remain dizzy on standing up.  Pending lumbar puncture today Level of care: Telemetry Medical   Dispo: The patient is from: Home              Anticipated d/c is to: Pending clinical course              Patient currently is not medically stable to d/c.   Difficult to place patient No     Infusions:    Scheduled Meds:  aspirin EC  81 mg Oral Daily   diltiazem  240 mg Oral Daily   enoxaparin (LOVENOX) injection  40 mg Subcutaneous Q24H   losartan  100 mg Oral Daily   mometasone-formoterol  2 puff Inhalation BID   montelukast  10 mg Oral QHS   pantoprazole  40 mg Oral Daily   phenytoin  400 mg Oral Daily   pneumococcal 20-valent conjugate vaccine  0.5 mL Intramuscular Tomorrow-1000   polyethylene glycol  17 g Oral Daily   rosuvastatin  20 mg Oral Daily    PRN meds: albuterol, meclizine, oxyCODONE, tiZANidine   Antimicrobials: Anti-infectives (From admission, onward)    None       Objective: Vitals:   01/23/22 0827 01/23/22 1231  BP: (!) 153/73 136/65  Pulse: 62 65  Resp: 17 19  Temp: 98.1 F (36.7 C) 98.2 F (36.8 C)  SpO2: 99% 98%   No intake or output data in the 24 hours ending 01/23/22 1350  Filed Weights   01/18/22 1726 01/19/22 1813  Weight: 97.5 kg 99.8 kg   Weight change:  Body mass index is 40.24 kg/m.   Physical Exam: General exam: Pleasant, middle-aged.  Not in physical distress at rest Skin: No rashes, lesions or ulcers. HEENT: Atraumatic, normocephalic, no obvious bleeding Lungs: Clear to auscultation bilaterally CVS: Sinus bradycardia, no murmur GI/Abd soft, nontender, nondistended, bowel sound present CNS: Alert, awake, oriented x3 Psychiatry: Frustrated because of persistent dizziness Extremities: No pedal edema, no calf tenderness  Data Review: I have personally reviewed the laboratory data and studies available.  F/u labs ordered Unresulted Labs (From admission, onward)    None       Signed, Lorin Glass,  MD [facility] 01/23/2022

## 2022-01-24 ENCOUNTER — Inpatient Hospital Stay (HOSPITAL_COMMUNITY): Payer: Medicare Other

## 2022-01-24 DIAGNOSIS — G932 Benign intracranial hypertension: Secondary | ICD-10-CM

## 2022-01-24 DIAGNOSIS — R55 Syncope and collapse: Secondary | ICD-10-CM | POA: Diagnosis not present

## 2022-01-24 LAB — CSF CELL COUNT WITH DIFFERENTIAL
RBC Count, CSF: 19 /mm3 — ABNORMAL HIGH
Tube #: 3
WBC, CSF: 7 /mm3 — ABNORMAL HIGH (ref 0–5)

## 2022-01-24 LAB — PHENYTOIN LEVEL, TOTAL: Phenytoin Lvl: 11.4 ug/mL (ref 10.0–20.0)

## 2022-01-24 LAB — GLUCOSE, CSF: Glucose, CSF: 75 mg/dL — ABNORMAL HIGH (ref 40–70)

## 2022-01-24 LAB — PROTEIN, CSF: Total  Protein, CSF: 44 mg/dL (ref 15–45)

## 2022-01-24 MED ORDER — ACETAZOLAMIDE 250 MG PO TABS
500.0000 mg | ORAL_TABLET | Freq: Two times a day (BID) | ORAL | Status: DC
Start: 2022-01-24 — End: 2022-01-26
  Administered 2022-01-24 – 2022-01-26 (×4): 500 mg via ORAL
  Filled 2022-01-24 (×5): qty 2

## 2022-01-24 MED ORDER — ACETAZOLAMIDE SODIUM 500 MG IJ SOLR
500.0000 mg | Freq: Two times a day (BID) | INTRAMUSCULAR | Status: DC
Start: 2022-01-24 — End: 2022-01-24

## 2022-01-24 MED ORDER — LIDOCAINE HCL (PF) 1 % IJ SOLN: 5.0000 mL | Freq: Once | INTRAMUSCULAR | Status: DC

## 2022-01-24 NOTE — Progress Notes (Signed)
Physical Therapy Treatment Patient Details Name: Gloria Hurst MRN: [id number] DOB: July 16, 1960 Today's Date: 01/24/2022   History of Present Illness Pt is a 61 y.o. female who presented 01/18/22 with concerns of multiple episodes of syncope. CT and MRI of head were negative. CT of cervical spine was negative. No evidence of PE. 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 PMH: TIA in 2019, seizure on phenytoin, HTN, pulmonary hypertension, asthma, GERD    PT Comments    Pt with c/o 8/10 back pain from attempted LP yesterday. Pt with improved ability to open her eyes and hold head in less in extension compared to yesterday. Pt continues to report dizziness however ambulation more limited by back pain today. Pt planned to go to [place] today for guided LP at [facility]. Acute PT to return as able, as approriate. to re-assess mobility s/p LP.    Recommendations for follow up therapy are one component of a multi-disciplinary discharge planning process, led by the attending physician.  Recommendations may be updated based on patient status, additional functional criteria and insurance authorization.  Follow Up Recommendations  Home health PT     Assistance Recommended at Discharge Intermittent Supervision/Assistance  Patient can return home with the following A little help with bathing/dressing/bathroom;Assistance with cooking/housework;Assist for transportation;Help with stairs or ramp for entrance   Equipment Recommendations  Rolling walker (2 wheels);BSC/3in1    Recommendations for Other Services       Precautions / Restrictions Precautions Precautions: Fall Precaution Comments: vertebral artery issues?? Restrictions Weight Bearing Restrictions: No     Mobility  Bed Mobility Overal bed mobility: Needs Assistance Bed Mobility: Supine to Sit     Supine to sit: Supervision, HOB elevated     General bed mobility comments: Supervision for safety, HOB  elevated    Transfers Overall transfer level: Needs assistance Equipment used: Rolling walker (2 wheels), None Transfers: Sit to/from Stand Sit to Stand: Min guard           General transfer comment: min guard, verbal cues for hand placement/not to pull up on walker, pt with report of dizziness upon standing, pt keeping eyes minimally open t/o session    Ambulation/Gait Ambulation/Gait assistance: Min guard Gait Distance (Feet): 60 Feet Assistive device: Rolling walker (2 wheels) Gait Pattern/deviations: Step-through pattern, Decreased stride length Gait velocity: reduced Gait velocity interpretation: <1.31 ft/sec, indicative of household ambulator   General Gait Details: pt limited today by back pain, pt also with report of dizziness however pain more of a problem today, pt keeping eyes open better today and head in less extension   Stairs             Wheelchair Mobility    Modified Rankin (Stroke Patients Only) Modified Rankin (Stroke Patients Only) Pre-Morbid Rankin Score: No significant disability Modified Rankin: Moderately severe disability     Balance Overall balance assessment: Needs assistance Sitting-balance support: No upper extremity supported, Feet supported, Bilateral upper extremity supported Sitting balance-Leahy Scale: Fair Sitting balance - Comments: Able to stand without UE support, but as time in standing increased so did her trunk sway, needing min guard initially then minA to prevent LOB, benefits from RW for mobility   Standing balance support: No upper extremity supported, During functional activity Standing balance-Leahy Scale: Good Standing balance comment: Static standing EOB with supervision for safety  Cognition Arousal/Alertness: Awake/alert Behavior During Therapy: WFL for tasks assessed/performed Overall Cognitive Status: Within Functional Limits for tasks assessed                                  General Comments: Follows commands appropriately. Tends to close eyes when feeling lightheaded, needs reminders to keep eyes open        Exercises      General Comments General comments (skin integrity, edema, etc.): VSS      Pertinent Vitals/Pain Pain Assessment Pain Assessment: 0-10 Faces Pain Scale: Hurts whole lot Pain Location: back pain a LP attempt site in low back Pain Descriptors / Indicators: Grimacing, Discomfort    Home Living                          Prior Function            PT Goals (current goals can now be found in the care plan section) Acute Rehab PT Goals Patient Stated Goal: to get better PT Goal Formulation: With patient Time For Goal Achievement: 02/05/22 Potential to Achieve Goals: Good Progress towards PT goals: Progressing toward goals    Frequency    Min 3X/week      PT Plan Current plan remains appropriate    Co-evaluation              AM-PAC PT "6 Clicks" Mobility   Outcome Measure  Help needed turning from your back to your side while in a flat bed without using bedrails?: A Little Help needed moving from lying on your back to sitting on the side of a flat bed without using bedrails?: A Little Help needed moving to and from a bed to a chair (including a wheelchair)?: A Little Help needed standing up from a chair using your arms (e.g., wheelchair or bedside chair)?: A Little Help needed to walk in hospital room?: A Little Help needed climbing 3-5 steps with a railing? : A Lot 6 Click Score: 17    End of Session Equipment Utilized During Treatment: Gait belt Activity Tolerance: Patient tolerated treatment well Patient left: with call bell/phone within reach;with family/visitor present;in bed Nurse Communication: Mobility status;Other (comment) (findings of eval) PT Visit Diagnosis: Unsteadiness on feet (R26.81);Other abnormalities of gait and mobility (R26.89);Muscle weakness (generalized)  (M62.81);Difficulty in walking, not elsewhere classified (R26.2);History of falling (Z91.81)     Time: 3532-9924 PT Time Calculation (min) (ACUTE ONLY): 14 min  Charges:  $Gait Training: 8-22 mins                     Lewis Shock, PT, DPT Acute Rehabilitation Services Secure chat preferred Office #: [phone]    Gloria Hurst 01/24/2022, 12:20 PM

## 2022-01-24 NOTE — Progress Notes (Signed)
PROGRESS NOTE  Gloria Hurst  DOB: [birth year]  PCP: [facility] INO:[id number]  DOA: 01/18/2022  LOS: 1 day  Hospital Day: 7  Brief narrative: Gloria Hurst is a 61 y.o. female with PMH significant for TIA in 2019, seizure on phenytoin, HTN, pulmonary hypertension, asthma, GERD Patient presented to the ED on 7/20 with complaint of multiple episodes of syncope. She had 2 episodes during the July 4 holiday.  At that time she was coming out of the pool and felt lightheaded and dizzy and asked a family member to hold her and then had loss of consciousness.  She then returned home and was walking up the stairs when again she felt unwell and lightheaded and had a second episode of loss of consciousness. Family felt she was confused afterwards. 2 days prior to presentation, she was walking out of a clinic and felt lightheaded.  She then got inside her car and passed out while she was there. Lightheadedness and weakness are her premonition symptoms each time.  No complaint of chest pain or palpitation. Reports compliance to medicines.  No recent changes in medications. She takes oxycodone and tizanidine for back pain but only takes this occasionally.  In the ED, patient is afebrile, heart rate in 60s, blood pressure in 160s, breathing on room air. Unremarkable CBC, BMP CT head and CT cervical spine did not show any acute intracranial process or fracture or subluxation of cervical spine D-dimer slightly elevated but CT angio chest did not show any evidence of acute PE or any other acute cardiopulmonary process. Admitted to hospitalist service  Subjective: Patient was seen and examined this morning. Not in distress.  Still dizzy.  Pending fluoroscopic guided lumbar puncture today  Assessment and plan: Syncope -Presented with multiple episodes of syncope in last 3 weeks with premonition of lightheadedness, dizziness  -Extensive work-up was done to rule out differential diagnoses:  Orthostatic syncope, cardiac arrhythmia, posterior circulation stroke -Echocardiogram with normal EF, carotid duplex with nonsignificant stenosis. -MRI brain and MRA head and neck done this morning did not show any evidence of acute infarct or large vessel occlusion.  It showed mild chronic small vessel disease.  It also showed partially empty sella turcica was suggestive of pseudotumor cerebri. -Neurology consulted.  Per neurology, the constellation of her symptoms are likely suggestive of pseudotumor cerebri. -7/25, neurology tried bedside LP which was not successful. -7/26, radiology to try fluoroscopy-guided lumbar puncture.  Essential hypertension -Blood pressure elevated throughout the night to 160s  -PTA on cardizem, losartan, triamterene-HCTZ -Currently on reduced dose of Cardizem and losartan. Continue to hold Triamterene-HCTZ. -No history of A-fib.  Was seen by cardiologist Dr. Tollie Pizza at [facility] few months ago.  Had a Zio patch in place.  Patient reports it did not show any arrhythmia.  History of seizure -Phenytoin level sent on admission was reported as 'none detected'. EEG unremarkable -Phenytoin has been hence stopped.    Low back pain Continue PRN oxycodone and Tizanidine-pt reports these doses have been unchanged and she does not take it daily   Goals of care   Code Status: Full Code    Mobility: Encourage ambulation.  Skin assessment:     Nutritional status:  Body mass index is 40.24 kg/m.          Diet:  Diet Order             Diet Heart Room service appropriate? Yes; Fluid consistency: Thin  Diet effective now  DVT prophylaxis:     Antimicrobials: None Fluid: None at this time Consultants: None Family Communication: None at bedside  Status is: Inpatient  Continue in-hospital care because: Continues to remain dizzy on standing up.  Pending lumbar puncture today Level of care: Telemetry Medical   Dispo: The  patient is from: Home              Anticipated d/c is to: Pending clinical course              Patient currently is not medically stable to d/c.   Difficult to place patient No     Infusions:    Scheduled Meds:  aspirin EC  81 mg Oral Daily   diltiazem  240 mg Oral Daily   lidocaine (PF)  5 mL Intradermal Once   losartan  100 mg Oral Daily   mometasone-formoterol  2 puff Inhalation BID   montelukast  10 mg Oral QHS   pantoprazole  40 mg Oral Daily   phenytoin  400 mg Oral Daily   pneumococcal 20-valent conjugate vaccine  0.5 mL Intramuscular Tomorrow-1000   polyethylene glycol  17 g Oral Daily   rosuvastatin  20 mg Oral Daily    PRN meds: albuterol, meclizine, oxyCODONE, tiZANidine   Antimicrobials: Anti-infectives (From admission, onward)    None       Objective: Vitals:   01/24/22 0825 01/24/22 1120  BP:  (!) 149/80  Pulse:  69  Resp:  16  Temp:  98.3 F (36.8 C)  SpO2: 98% 100%    Intake/Output Summary (Last 24 hours) at 01/24/2022 1418 Last data filed at 01/24/2022 0830 Gross per 24 hour  Intake 240 ml  Output --  Net 240 ml    Filed Weights   01/18/22 1726 01/19/22 1813  Weight: 97.5 kg 99.8 kg   Weight change:  Body mass index is 40.24 kg/m.   Physical Exam: General exam: Pleasant, middle-aged.  Not in physical distress at rest Skin: No rashes, lesions or ulcers. HEENT: Atraumatic, normocephalic, no obvious bleeding Lungs: Clear to auscultation bilaterally CVS: Sinus bradycardia, no murmur GI/Abd soft, nontender, nondistended, bowel sound present CNS: Alert, awake, oriented x3 Psychiatry: Frustrated because of persistent dizziness Extremities: No pedal edema, no calf tenderness  Data Review: I have personally reviewed the laboratory data and studies available.  F/u labs ordered Unresulted Labs (From admission, onward)     Start     Ordered   01/24/22 1236  Glucose, CSF  RELEASE UPON ORDERING,   TIMED        01/24/22 1236   01/24/22  1236  Protein, CSF  RELEASE UPON ORDERING,   TIMED        01/24/22 1236   01/24/22 1236  CSF cell count with differential  RELEASE UPON ORDERING,   TIMED        01/24/22 1236            Signed, Lorin Glass, MD [facility] 01/24/2022

## 2022-01-24 NOTE — TOC Initial Note (Signed)
Transition of Care [facility]) - Initial/Assessment Note    Patient Details  Name: Gloria Hurst MRN: [id number] Date of Birth: Jul 21, 1960  Transition of [facility]) CM/SW Contact:    Kermit Balo, RN Phone Number: 01/24/2022, 2:46 PM  Clinical Narrative:                 Patient is from home with her spouse. Her spouse works during the day and at night. She states her sister lives fairly close and can provide some assistance if needed.  Pt was driving prior to admission but states her insurance has transportation benefits that she will use as needed.  Pt manages her own medications without any issues.  Recommendations for home health therapy. Pt has no preference for the agency. CM has arranged home health with [facility]. Information on the AVS. Walker and 3 in 1 recommended for home will be ordered through [employer] and delivered to the patients room. TOC following for further d/c needs.   Expected Discharge Plan: Home w Home Health Services Barriers to Discharge: Continued Medical Work up   Patient Goals and CMS Choice   CMS Medicare.gov Compare Post Acute Care list provided to:: Patient Choice offered to / list presented to : Patient  Expected Discharge Plan and Services Expected Discharge Plan: Home w Home Health Services   Discharge Planning Services: CM Consult Post Acute Care Choice: Home Health Living arrangements for the past 2 months: Single Family Home                           HH Arranged: PT HH Agency: [facility] Date [facility] Agency Contacted: 01/24/22   Representative spoke with at [facility] Agency: Kandee Keen  Prior Living Arrangements/Services Living arrangements for the past 2 months: Single Family Home Lives with:: Spouse Patient language and need for interpreter reviewed:: Yes Do you feel safe going back to the place where you live?: Yes          Current home services: DME (cane) Criminal Activity/Legal Involvement Pertinent to Current  Situation/Hospitalization: No - Comment as needed  Activities of Daily Living Home Assistive Devices/Equipment: Grab bars in shower, Cane (specify quad or straight) ADL Screening (condition at time of admission) Patient's cognitive ability adequate to safely complete daily activities?: Yes Is the patient deaf or have difficulty hearing?: No Does the patient have difficulty seeing, even when wearing glasses/contacts?: No Does the patient have difficulty concentrating, remembering, or making decisions?: No Patient able to express need for assistance with ADLs?: Yes Does the patient have difficulty dressing or bathing?: No Independently performs ADLs?: Yes (appropriate for developmental age) Does the patient have difficulty walking or climbing stairs?: Yes Weakness of Legs: Both Weakness of Arms/Hands: None  Permission Sought/Granted                  Emotional Assessment Appearance:: Appears stated age Attitude/Demeanor/Rapport: Engaged Affect (typically observed): Accepting Orientation: : Oriented to Self, Oriented to Place, Oriented to  Time, Oriented to Situation   Psych Involvement: No (comment)  Admission diagnosis:  Syncope [R55] Syncope, unspecified syncope type [R55] Dizzinesses [R42] Patient Active Problem List   Diagnosis Date Noted   Dizzinesses 01/23/2022   Syncope 01/18/2022   Altered mental state 01/06/2018   GERD (gastroesophageal reflux disease) 01/06/2018   Gum disease 01/06/2018   Shortness of breath 01/06/2018   Sleep apnea 01/06/2018   Left leg weakness 01/06/2018   Epigastric pain 08/31/2015   Cholecystitis  08/30/2015   Duodenitis 08/30/2015   Essential hypertension 08/30/2015   Hematochezia 08/30/2015   Hyperlipidemia 08/30/2015   Seizures (HCC) 08/30/2015   Bilateral carpal tunnel syndrome 03/24/2014   Low back pain 02/04/2014   Lumbar radiculopathy 02/04/2014   Lumbar spondylosis 02/04/2014   Right knee pain 02/04/2014   PCP:  [facility]:   [id number] - [place], [place] - [address] [place] [address] Phone: [phone] Fax: [phone]     Social Determinants of Health (SDOH) Interventions    Readmission Risk Interventions     No data to display

## 2022-01-24 NOTE — Progress Notes (Addendum)
Patient symptoms are relatively unchanged.  Continues to have mild dizziness.  She is awake, alert, interactive and appropriate, currently laying down per post LP protocol.  She had an LP which demonstrated an opening pressure of 26, which is elevated.  I question some mild changes in her disc, though still having difficulty with good visualization.  I think that she will need follow-up with outpatient ophthalmology.  I do not think there is any significance to the mild elevation in her WBC given normal protein and few red cells present, and borderline number.  With symptoms of dizziness, diplopia on leftward gaze, positional headaches, elevated ICP coupled with the work-up excluding other causes including:  EEG CTA(including visualization of the venous sinuses) MRI Phenytoin level Orthostatic vitals  I suspect that this does represent pseudotumor cerebri.  I would favor starting her on Diamox 500 mg twice daily and having her follow-up with outpatient neurology and ophthalmology, as well as home health PT per PT recommendations.  Neurology will be available as needed, please call with further questions or concerns.  Ritta Slot, MD [facility] [phone]  If 7pm- 7am, please page neurology on call as listed in AMION.

## 2022-01-25 DIAGNOSIS — R55 Syncope and collapse: Secondary | ICD-10-CM | POA: Diagnosis not present

## 2022-01-25 MED ORDER — DIPHENHYDRAMINE HCL 25 MG PO CAPS
25.0000 mg | ORAL_CAPSULE | Freq: Once | ORAL | Status: AC
  Administered 2022-01-25: 25 mg via ORAL
  Filled 2022-01-25: qty 1

## 2022-01-25 MED ORDER — OXYCODONE HCL 5 MG PO TABS
5.0000 mg | ORAL_TABLET | Freq: Four times a day (QID) | ORAL | Status: DC | PRN
  Administered 2022-01-25 – 2022-01-26 (×2): 5 mg via ORAL
  Filled 2022-01-25 (×2): qty 1

## 2022-01-25 NOTE — Progress Notes (Signed)
Physical Therapy Treatment Patient Details Name: Gloria Hurst MRN: [id number] DOB: 04-Feb-1961 Today's Date: 01/25/2022   History of Present Illness Pt is a 61 y.o. female who presented 01/18/22 with concerns of multiple episodes of syncope. CT and MRI of head were negative. CT of cervical spine was negative. No evidence of PE. 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 PMH: TIA in 2019, seizure on phenytoin, HTN, pulmonary hypertension, asthma, GERD    PT Comments    Patient progressing with ambulation distance and able to negotiate stairs today.  Hopeful for home tomorrow and feel initial HHPT still appropriate, though would hope to transition to outpatient PT soon for vestibular rehab.  Patient reports family supportive and caring for her garden, chickens and dog and cat while she is here.  PT to follow up if not d/c.    Recommendations for follow up therapy are one component of a multi-disciplinary discharge planning process, led by the attending physician.  Recommendations may be updated based on patient status, additional functional criteria and insurance authorization.  Follow Up Recommendations  Home health PT     Assistance Recommended at Discharge Intermittent Supervision/Assistance  Patient can return home with the following A little help with bathing/dressing/bathroom;Assistance with cooking/housework;Assist for transportation;Help with stairs or ramp for entrance   Equipment Recommendations  Rolling walker (2 wheels);BSC/3in1    Recommendations for Other Services       Precautions / Restrictions Precautions Precautions: Fall     Mobility  Bed Mobility         Supine to sit: Modified independent (Device/Increase time)          Transfers   Equipment used: Rolling walker (2 wheels) Transfers: Sit to/from Stand Sit to Stand: Supervision                Ambulation/Gait Ambulation/Gait assistance: Supervision, Min  guard Gait Distance (Feet): 200 Feet Assistive device: Rolling walker (2 wheels) Gait Pattern/deviations: Step-through pattern, Decreased stride length, Shuffle       General Gait Details: dizziness throughout; stopping x 1 to recover, cues for stationary target   Stairs Stairs: Yes Stairs assistance: Min guard Stair Management: One rail Right, Step to pattern, Forwards, Sideways Number of Stairs: 2 (x 2) General stair comments: forward with both rails first, then with R rail side stepping with cues for technique and support for safety   Wheelchair Mobility    Modified Rankin (Stroke Patients Only) Modified Rankin (Stroke Patients Only) Pre-Morbid Rankin Score: No significant disability Modified Rankin: Moderately severe disability     Balance Overall balance assessment: Needs assistance Sitting-balance support: Feet supported Sitting balance-Leahy Scale: Good     Standing balance support: No upper extremity supported Standing balance-Leahy Scale: Fair                              Cognition Arousal/Alertness: Awake/alert Behavior During Therapy: WFL for tasks assessed/performed Overall Cognitive Status: Within Functional Limits for tasks assessed                                          Exercises      General Comments        Pertinent Vitals/Pain Pain Assessment Faces Pain Scale: No hurt    Home Living  Prior Function            PT Goals (current goals can now be found in the care plan section) Progress towards PT goals: Progressing toward goals    Frequency    Min 3X/week      PT Plan Current plan remains appropriate    Co-evaluation              AM-PAC PT "6 Clicks" Mobility   Outcome Measure  Help needed turning from your back to your side while in a flat bed without using bedrails?: None Help needed moving from lying on your back to sitting on the side of a flat  bed without using bedrails?: None Help needed moving to and from a bed to a chair (including a wheelchair)?: A Little Help needed standing up from a chair using your arms (e.g., wheelchair or bedside chair)?: A Little Help needed to walk in hospital room?: A Little Help needed climbing 3-5 steps with a railing? : A Little 6 Click Score: 20    End of Session Equipment Utilized During Treatment: Gait belt Activity Tolerance: Patient tolerated treatment well Patient left: in chair;with call bell/phone within reach   PT Visit Diagnosis: Other abnormalities of gait and mobility (R26.89);History of falling (Z91.81);Other symptoms and signs involving the nervous system (R29.898)     Time: 0321-2248 PT Time Calculation (min) (ACUTE ONLY): 16 min  Charges:  $Gait Training: 8-22 mins                     Sheran Lawless, PT Acute Rehabilitation Services [phone] 01/25/2022    Gloria Hurst 01/25/2022, 4:57 PM

## 2022-01-25 NOTE — Progress Notes (Signed)
PROGRESS NOTE  Gloria Hurst  DOB: 1961-03-17  PCP: [facility] GEX:[id number]  DOA: 01/18/2022  LOS: 2 days  Hospital Day: 8  Brief narrative: Gloria Hurst is a 61 y.o. female with PMH significant for TIA in 2019, seizure on phenytoin, HTN, pulmonary hypertension, asthma, GERD Patient presented to the ED on 7/20 with complaint of multiple episodes of syncope. She had 2 episodes during the July 4 holiday.  At that time she was coming out of the pool and felt lightheaded and dizzy and asked a family member to hold her and then had loss of consciousness.  She then returned home and was walking up the stairs when again she felt unwell and lightheaded and had a second episode of loss of consciousness. Family felt she was confused afterwards. 2 days prior to presentation, she was walking out of a clinic and felt lightheaded.  She then got inside her car and passed out while she was there. Lightheadedness and weakness are her premonition symptoms each time.  No complaint of chest pain or palpitation. Reports compliance to medicines.  No recent changes in medications. She takes oxycodone and tizanidine for back pain but only takes this occasionally.  In the ED, patient is afebrile, heart rate in 60s, blood pressure in 160s, breathing on room air. Unremarkable CBC, BMP CT head and CT cervical spine did not show any acute intracranial process or fracture or subluxation of cervical spine D-dimer slightly elevated but CT angio chest did not show any evidence of acute PE or any other acute cardiopulmonary process. Admitted to hospitalist service  Subjective: Patient was seen and examined this morning. Still remains single dizzy opening eyes.  Vision remains blurred.  Unable to get up and safely walk to the bathroom.  Assessment and plan: Syncope -Presented with multiple episodes of syncope in last 3 weeks with premonition of lightheadedness, dizziness  -Extensive work-up was done to  rule out differential diagnoses: Orthostatic syncope, cardiac arrhythmia, posterior circulation stroke -Echocardiogram with normal EF, carotid duplex with nonsignificant stenosis. -MRI brain and MRA head and neck done this morning did not show any evidence of acute infarct or large vessel occlusion.  It showed mild chronic small vessel disease.  It also showed partially empty sella turcica was suggestive of pseudotumor cerebri. -Neurology consulted.  Per neurology, the constellation of her symptoms are likely suggestive of pseudotumor cerebri. -7/25, neurology tried bedside LP which was not successful. -7/26, radiology to try fluoroscopy-guided lumbar puncture.  Per discussion with neurology, patient has been started on acetazolamide 500 mg twice daily.  Patient states she has not felt any improvement in last 24 hours.  Also has as needed meclizine available.  Essential hypertension -PTA on cardizem, losartan, triamterene-HCTZ -Currently blood pressure is controlled reduced dose of Cardizem, losartan and newly added acetazolamide. -No history of A-fib.  Was seen by cardiologist Dr. Tollie Pizza at [facility] few months ago.  Had a Zio patch in place.  Patient reports it did not show any arrhythmia.  Reported history of seizure -Phenytoin level sent on admission was reported as 'none detected'. EEG unremarkable -Phenytoin has been hence stopped.    Low back pain Continue PRN oxycodone and Tizanidine.     Goals of care   Code Status: Full Code    Mobility: Encourage ambulation.  Skin assessment:     Nutritional status:  Body mass index is 40.24 kg/m.          Diet:  Diet Order  Diet Heart Room service appropriate? Yes; Fluid consistency: Thin  Diet effective now                   DVT prophylaxis:     Antimicrobials: None Fluid: None at this time Consultants: None Family Communication: None at bedside  Status is: Inpatient  Continue in-hospital care  because: Continues to remain dizzy on standing up..  Unable to safely ambulate. Level of care: Telemetry Medical   Dispo: The patient is from: Home              Anticipated d/c is to: Pending clinical course              Patient currently is not medically stable to d/c.   Difficult to place patient No     Infusions:    Scheduled Meds:  acetaZOLAMIDE  500 mg Oral BID   aspirin EC  81 mg Oral Daily   diltiazem  240 mg Oral Daily   lidocaine (PF)  5 mL Intradermal Once   losartan  100 mg Oral Daily   mometasone-formoterol  2 puff Inhalation BID   montelukast  10 mg Oral QHS   pantoprazole  40 mg Oral Daily   phenytoin  400 mg Oral Daily   pneumococcal 20-valent conjugate vaccine  0.5 mL Intramuscular Tomorrow-1000   polyethylene glycol  17 g Oral Daily   rosuvastatin  20 mg Oral Daily    PRN meds: albuterol, meclizine, oxyCODONE, tiZANidine   Antimicrobials: Anti-infectives (From admission, onward)    None       Objective: Vitals:   01/25/22 0826 01/25/22 1107  BP:  (!) 155/83  Pulse: 63 63  Resp:  14  Temp:  98.6 F (37 C)  SpO2: 96% 97%    Intake/Output Summary (Last 24 hours) at 01/25/2022 1455 Last data filed at 01/25/2022 0900 Gross per 24 hour  Intake 240 ml  Output --  Net 240 ml    Filed Weights   01/18/22 1726 01/19/22 1813  Weight: 97.5 kg 99.8 kg   Weight change:  Body mass index is 40.24 kg/m.   Physical Exam: General exam: Pleasant, middle-aged.  Not in physical distress Skin: No rashes, lesions or ulcers. HEENT: Atraumatic, normocephalic, no obvious bleeding Lungs: Clear to auscultation bilaterally CVS: Sinus bradycardia, no murmur GI/Abd soft, nontender, nondistended, bowel sound present CNS: Alert, awake, oriented x3 Psychiatry: Frustrated because of persistent dizziness Extremities: No pedal edema, no calf tenderness  Data Review: I have personally reviewed the laboratory data and studies available.  F/u labs  ordered Unresulted Labs (From admission, onward)    None       Signed, Lorin Glass, MD [facility] 01/25/2022

## 2022-01-26 DIAGNOSIS — R55 Syncope and collapse: Secondary | ICD-10-CM | POA: Diagnosis not present

## 2022-01-26 LAB — BASIC METABOLIC PANEL
Anion gap: 8 (ref 5–15)
BUN: 14 mg/dL (ref 6–20)
CO2: 21 mmol/L — ABNORMAL LOW (ref 22–32)
Calcium: 8.8 mg/dL — ABNORMAL LOW (ref 8.9–10.3)
Chloride: 100 mmol/L (ref 98–111)
Creatinine, Ser: 0.78 mg/dL (ref 0.44–1.00)
GFR, Estimated: >60 mL/min (ref >60)
Glucose, Bld: 112 mg/dL — ABNORMAL HIGH (ref 70–99)
Potassium: 3.6 mmol/L (ref 3.5–5.1)
Sodium: 129 mmol/L — ABNORMAL LOW (ref 135–145)

## 2022-01-26 MED ORDER — SODIUM BICARBONATE 650 MG PO TABS: 650.0000 mg | ORAL_TABLET | Freq: Two times a day (BID) | ORAL | 2 refills | Status: AC

## 2022-01-26 MED ORDER — DILTIAZEM HCL ER COATED BEADS 180 MG PO CP24: 180.0000 mg | ORAL_CAPSULE | Freq: Every day | ORAL | 2 refills | Status: AC

## 2022-01-26 MED ORDER — SODIUM BICARBONATE 650 MG PO TABS
650.0000 mg | ORAL_TABLET | Freq: Two times a day (BID) | ORAL | Status: DC
  Administered 2022-01-26: 650 mg via ORAL
  Filled 2022-01-26: qty 1

## 2022-01-26 MED ORDER — MECLIZINE HCL 12.5 MG PO TABS: 12.5000 mg | ORAL_TABLET | Freq: Three times a day (TID) | ORAL | 0 refills | Status: AC | PRN

## 2022-01-26 MED ORDER — ACETAZOLAMIDE 250 MG PO TABS
500.0000 mg | ORAL_TABLET | Freq: Two times a day (BID) | ORAL | 2 refills | Status: AC
Start: 2022-01-26 — End: 2022-04-26

## 2022-01-26 MED ORDER — DILTIAZEM HCL ER COATED BEADS 180 MG PO CP24: 180.0000 mg | ORAL_CAPSULE | Freq: Every day | ORAL | Status: DC

## 2022-01-26 NOTE — Discharge Summary (Signed)
Physician Discharge Summary  [name] GNF:[id number] DOB: 1960/11/22 DOA: 01/18/2022  PCP: [facility]  Admit date: 01/18/2022 Discharge date: 01/26/2022  Admitted From: Home Discharge disposition: Home with home PT  Recommendations at discharge:  You been started on a medicine for dizziness: acetazolamide 500 mg twice daily for next several weeks.  Also take sodium bicarbonate 650 mg twice daily to minimize its metabolic side effects Follow-up with neurology as an outpatient Need to follow-up with ophthalmology as an outpatient to look for papilledema.  Brief narrative: Gloria Hurst is a 61 y.o. female with PMH significant for TIA in 2019, seizure on phenytoin, HTN, pulmonary hypertension, asthma, GERD Patient presented to the ED on 7/20 with complaint of multiple episodes of syncope. She had 2 episodes during the July 4 holiday.  At that time she was coming out of the pool and felt lightheaded and dizzy and asked a family member to hold her and then had loss of consciousness.  She then returned home and was walking up the stairs when again she felt unwell and lightheaded and had a second episode of loss of consciousness. Family felt she was confused afterwards. 2 days prior to presentation, she was walking out of a clinic and felt lightheaded.  She then got inside her car and passed out while she was there. Lightheadedness and weakness are her premonition symptoms each time.  No complaint of chest pain or palpitation. Reports compliance to medicines.  No recent changes in medications. She takes oxycodone and tizanidine for back pain but only takes this occasionally.  In the ED, patient is afebrile, heart rate in 60s, blood pressure in 160s, breathing on room air. Unremarkable CBC, BMP CT head and CT cervical spine did not show any acute intracranial process or fracture or subluxation of cervical spine D-dimer slightly elevated but CT angio chest did not show any evidence  of acute PE or any other acute cardiopulmonary process. Admitted to hospitalist service  Subjective: Patient was seen and examined this morning. Sitting up at the edge of the bed.  Not in distress.  Still feels dizzy especially on standing up.  But after waiting some time, she is able to walk to the hallway.  Assessment and plan: Pseudotumor cerebri -Presented with multiple episodes of syncope in last 3 weeks with premonition of lightheadedness, dizziness  -Extensive work-up was done to rule out differential diagnoses: Orthostatic syncope, cardiac arrhythmia, posterior circulation stroke -Echocardiogram with normal EF, carotid duplex with nonsignificant stenosis. -MRI brain and MRA head and neck done this morning did not show any evidence of acute infarct or large vessel occlusion.  It showed mild chronic small vessel disease.  It also showed partially empty sella turcica was suggestive of pseudotumor cerebri. -Neurology consulted.  Per neurology, the constellation of her symptoms are likely suggestive of pseudotumor cerebri. -7/25, neurology tried bedside LP which was not successful. -7/26, radiology to try fluoroscopy-guided lumbar puncture.  Per discussion with neurology, patient has been started on acetazolamide 500 mg twice daily.  Patient is only gradually improving.  Per neurology, she needs to be on acetazolamide for several weeks.  I will discharge her on the same.  Also has as needed meclizine available. -Follow-up with neurology as an outpatient. -Need to follow-up with ophthalmology as an outpatient to look for papilledema.  Hyponatremia Metabolic acidosis -After few doses of acetazolamide, labs checked today showed sodium level down to 129 and serum bicarb down to 21.  I had an informal discussion with nephrologist  Dr. [name].  Acetazolamide could be precipitating a drop in bicarbonate but usually does not cause drop in sodium.  For last several days, patient has been nauseous  and dizzy and hence having low oral intake.  I wonder if it is contributing to hyponatremia. -At discharge today, I started on sodium bicarbonate 650 mg twice daily for the duration she will be on acetazolamide. Recent Labs  Lab 01/20/22 0651 01/26/22 0441  NA 134* 129*  K 3.7 3.6  CL 101 100  CO2 24 21*  GLUCOSE 105* 112*  BUN 10 14  CREATININE 0.84 0.78  CALCIUM 8.7* 8.8*    Essential hypertension -PTA on cardizem, losartan, triamterene-HCTZ -Currently blood pressure is controlled reduced dose of Cardizem, losartan and newly added acetazolamide.  Triamterene/HCTZ remain on hold.  I would not resume at discharge. -No history of A-fib.  Was seen by cardiologist [facility] Tollie PizzaKurt [facility] few months ago.  Had a Zio patch in place.  Patient reports it did not show any arrhythmia.  Reported history of seizure -Phenytoin level sent on admission was reported as 'none detected'. EEG unremarkable -Phenytoin has been hence stopped.    Low back pain Continue PRN oxycodone and Tizanidine.    Wounds:    Discharge Exam:   Vitals:   01/26/22 0723 01/26/22 0832 01/26/22 1023 01/26/22 1026  BP: (!) 144/74  138/62 133/62  Pulse: (!) 58   (!) 56  Resp: 18   18  Temp: 98 F (36.7 C)   97.9 F (36.6 C)  TempSrc:    Oral  SpO2: 100% 100%  100%  Weight:      Height:        Body mass index is 40.24 kg/m.  General exam: Pleasant, middle-aged.  Not in physical distress Skin: No rashes, lesions or ulcers. HEENT: Atraumatic, normocephalic, no obvious bleeding Lungs: Clear to auscultation bilaterally CVS: Sinus bradycardia, no murmur GI/Abd soft, nontender, nondistended, bowel sound present CNS: Alert, awake, oriented x3 Psychiatry: Frustrated because of persistent dizziness Extremities: No pedal edema, no calf tenderness  Follow ups:    Follow-up Information     [facility].   Specialty: Home Health Services Why: The home health agency will contact you  for the first home visit Contact information: [address] [place] [address] [phone]         [facility].   Contact information: [address] [place] [phone]                 Discharge Instructions:   Discharge Instructions     Call MD for:  difficulty breathing, headache or visual disturbances   Complete by: As directed    Call MD for:  extreme fatigue   Complete by: As directed    Call MD for:  hives   Complete by: As directed    Call MD for:  persistant dizziness or light-headedness   Complete by: As directed    Call MD for:  persistant nausea and vomiting   Complete by: As directed    Call MD for:  severe uncontrolled pain   Complete by: As directed    Call MD for:  temperature >100.4   Complete by: As directed    Diet general   Complete by: As directed    Discharge instructions   Complete by: As directed    Recommendations at discharge:   You been started on a medicine  for dizziness: acetazolamide 500 mg twice daily for next several weeks.  Also take sodium bicarbonate 650 mg twice daily to minimize its metabolic side effects  Follow-up with neurology as an outpatient  Need to follow-up with ophthalmology as an outpatient to look for papilledema.  General discharge instructions: Follow with Primary MD [facility] in 7 days  Please request your PCP  to go over your hospital tests, procedures, radiology results at the follow up. Please get your medicines reviewed and adjusted.  Your PCP may decide to repeat certain labs or tests as needed. Do not drive, operate heavy machinery, perform activities at heights, swimming or participation in water activities or provide baby sitting services if your were admitted for syncope or siezures until you have seen by Primary MD or a Neurologist and advised to do so again. [place] Controlled Substance Reporting System database was  reviewed. Do not drive, operate heavy machinery, perform activities at heights, swim, participate in water activities or provide baby-sitting services while on medications for pain, sleep and mood until your outpatient physician has reevaluated you and advised to do so again.  You are strongly recommended to comply with the dose, frequency and duration of prescribed medications. Activity: As tolerated with Full fall precautions use walker/cane & assistance as needed Avoid using any recreational substances like cigarette, tobacco, alcohol, or non-prescribed drug. If you experience worsening of your admission symptoms, develop shortness of breath, life threatening emergency, suicidal or homicidal thoughts you must seek [facility] attention immediately by calling 911 or calling your MD immediately  if symptoms less severe. You must read complete instructions/literature along with all the possible adverse reactions/side effects for all the medicines you take and that have been prescribed to you. Take any new medicine only after you have completely understood and accepted all the possible adverse reactions/side effects.  Wear Seat belts while driving. You were cared for by a hospitalist during your hospital stay. If you have any questions about your discharge medications or the care you received while you were in the hospital after you are discharged, you can call the unit and ask to speak with the hospitalist or the covering physician. Once you are discharged, your primary care physician will handle any further [facility] issues. Please note that NO REFILLS for any discharge medications will be authorized once you are discharged, as it is imperative that you return to your primary care physician (or establish a relationship with a primary care physician if you do not have one).   Increase activity slowly   Complete by: As directed        Discharge Medications:   Allergies as of 01/26/2022       Reactions    Bee Venom Anaphylaxis, Shortness Of Breath   Strawberry Extract Hives   Penicillins Rash   Has patient had a PCN reaction causing immediate rash, facial/tongue/throat swelling, SOB or lightheadedness with hypotension: Yes Has patient had a PCN reaction causing severe rash involving mucus membranes or skin necrosis: No Has patient had a PCN reaction that required hospitalization: No Has patient had a PCN reaction occurring within the last 10 years: No If all of the above answers are "NO", then may proceed with Cephalosporin use.        Medication List     STOP taking these medications    Dilantin 100 MG ER capsule Generic drug: phenytoin   diltiazem 300 MG 24 hr capsule Commonly known as: TIAZAC Replaced by: diltiazem 180 MG 24  hr capsule   etodolac 400 MG tablet Commonly known as: LODINE   ibuprofen 800 MG tablet Commonly known as: ADVIL   oxyCODONE 15 MG immediate release tablet Commonly known as: ROXICODONE   tiZANidine 2 MG tablet Commonly known as: ZANAFLEX   triamterene-hydrochlorothiazide 37.5-25 MG tablet Commonly known as: MAXZIDE-25       TAKE these medications    acetaZOLAMIDE 250 MG tablet Commonly known as: DIAMOX Take 2 tablets (500 mg total) by mouth 2 (two) times daily.   aspirin EC 81 MG tablet Take 1 tablet (81 mg total) by mouth daily.   budesonide-formoterol 80-4.5 MCG/ACT inhaler Commonly known as: SYMBICORT Inhale 2 puffs into the lungs at bedtime.   cholecalciferol 10 MCG (400 UNIT) Tabs tablet Commonly known as: VITAMIN D3 Take 400 Units by mouth daily.   Dexilant 60 MG capsule Generic drug: dexlansoprazole Take 60 mg by mouth daily.   diltiazem 180 MG 24 hr capsule Commonly known as: CARDIZEM CD Take 1 capsule (180 mg total) by mouth daily. Start taking on: January 27, 2022 Replaces: diltiazem 300 MG 24 hr capsule   losartan 100 MG tablet Commonly known as: COZAAR Take 100 mg by mouth daily.   meclizine 12.5 MG  tablet Commonly known as: ANTIVERT Take 1 tablet (12.5 mg total) by mouth 3 (three) times daily as needed for up to 7 days for dizziness.   montelukast 10 MG tablet Commonly known as: SINGULAIR Take 10 mg by mouth at bedtime.   multivitamin with minerals Tabs tablet Take 1 tablet by mouth daily. Centrum   PRESCRIPTION MEDICATION Inhale into the lungs at bedtime. CPAP   Proventil HFA 108 (90 Base) MCG/ACT inhaler Generic drug: albuterol Inhale 2 puffs into the lungs every 6 (six) hours as needed for wheezing or shortness of breath.   rosuvastatin 20 MG tablet Commonly known as: CRESTOR Take 1 tablet (20 mg total) by mouth daily.   sodium bicarbonate 650 MG tablet Take 1 tablet (650 mg total) by mouth 2 (two) times daily.               Durable [facility] Equipment  (From admission, onward)           Start     Ordered   01/24/22 1451  For home use only DME 3 n 1  Once        01/24/22 1450   01/24/22 1451  For home use only DME Walker rolling  Once       Question Answer Comment  Walker: With 5 Inch Wheels   Patient needs a walker to treat with the following condition Weakness      01/24/22 1450             The results of significant diagnostics from this hospitalization (including imaging, microbiology, ancillary and laboratory) are listed below for reference.    Procedures and Diagnostic Studies:   CT Head Wo Contrast  Result Date: 01/18/2022 CLINICAL DATA:  Syncopal episodes, headache, dizziness EXAM: CT HEAD WITHOUT CONTRAST CT CERVICAL SPINE WITHOUT CONTRAST TECHNIQUE: Multidetector CT imaging of the head and cervical spine was performed following the standard protocol without intravenous contrast. Multiplanar CT image reconstructions of the cervical spine were also generated. RADIATION DOSE REDUCTION: This exam was performed according to the departmental dose-optimization program which includes automated exposure control, adjustment of the mA and/or kV  according to patient size and/or use of iterative reconstruction technique. COMPARISON:  05/20/2021 CT head and cervical spine FINDINGS: CT HEAD FINDINGS  Brain: No evidence of acute infarct, hemorrhage, mass, mass effect, or midline shift. No hydrocephalus or extra-axial fluid collection. Vascular: No hyperdense vessel. Skull: Normal. Negative for fracture or focal lesion. Sinuses/Orbits: Mucous retention cysts in the right-greater-than-left maxillary sinus. Mucosal thickening in the ethmoid air cells. No acute finding in the orbits. Other: The mastoid air cells are well aerated. CT CERVICAL SPINE FINDINGS Alignment: Reversal of the normal cervical lordosis. No traumatic listhesis. Skull base and vertebrae: No acute fracture. No primary bone lesion or focal pathologic process. Soft tissues and spinal canal: No prevertebral fluid or swelling. No visible canal hematoma. Disc levels:  No high-grade spinal canal stenosis. Upper chest: No focal pulmonary opacity or pleural effusion in the imaged lungs. Other: None. IMPRESSION: 1.  No acute intracranial process. 2.  No acute fracture or traumatic listhesis in the cervical spine. Electronically Signed   By: Wiliam Ke M.D.   On: 01/18/2022 18:39   CT Cervical Spine Wo Contrast  Result Date: 01/18/2022 CLINICAL DATA:  Syncopal episodes, headache, dizziness EXAM: CT HEAD WITHOUT CONTRAST CT CERVICAL SPINE WITHOUT CONTRAST TECHNIQUE: Multidetector CT imaging of the head and cervical spine was performed following the standard protocol without intravenous contrast. Multiplanar CT image reconstructions of the cervical spine were also generated. RADIATION DOSE REDUCTION: This exam was performed according to the departmental dose-optimization program which includes automated exposure control, adjustment of the mA and/or kV according to patient size and/or use of iterative reconstruction technique. COMPARISON:  05/20/2021 CT head and cervical spine FINDINGS: CT HEAD FINDINGS  Brain: No evidence of acute infarct, hemorrhage, mass, mass effect, or midline shift. No hydrocephalus or extra-axial fluid collection. Vascular: No hyperdense vessel. Skull: Normal. Negative for fracture or focal lesion. Sinuses/Orbits: Mucous retention cysts in the right-greater-than-left maxillary sinus. Mucosal thickening in the ethmoid air cells. No acute finding in the orbits. Other: The mastoid air cells are well aerated. CT CERVICAL SPINE FINDINGS Alignment: Reversal of the normal cervical lordosis. No traumatic listhesis. Skull base and vertebrae: No acute fracture. No primary bone lesion or focal pathologic process. Soft tissues and spinal canal: No prevertebral fluid or swelling. No visible canal hematoma. Disc levels:  No high-grade spinal canal stenosis. Upper chest: No focal pulmonary opacity or pleural effusion in the imaged lungs. Other: None. IMPRESSION: 1.  No acute intracranial process. 2.  No acute fracture or traumatic listhesis in the cervical spine. Electronically Signed   By: Wiliam Ke M.D.   On: 01/18/2022 18:39   DG Chest Portable 1 View  Result Date: 01/18/2022 CLINICAL DATA:  Weakness EXAM: PORTABLE CHEST 1 VIEW COMPARISON:  09/11/2021 FINDINGS: The heart size and mediastinal contours are within normal limits. Both lungs are clear. The visualized skeletal structures are unremarkable. IMPRESSION: No active disease. Electronically Signed   By: Jasmine Pang M.D.   On: 01/18/2022 17:44     Labs:   Basic Metabolic Panel: Recent Labs  Lab 01/20/22 0651 01/26/22 0441  NA 134* 129*  K 3.7 3.6  CL 101 100  CO2 24 21*  GLUCOSE 105* 112*  BUN 10 14  CREATININE 0.84 0.78  CALCIUM 8.7* 8.8*   GFR Estimated Creatinine Clearance: 83.7 mL/min (by C-G formula based on SCr of 0.78 mg/dL). Liver Function Tests: Recent Labs  Lab 01/20/22 0651  AST 16  ALT 21  ALKPHOS 63  BILITOT 0.6  PROT 6.4*  ALBUMIN 3.5   No results for input(s): "LIPASE", "AMYLASE" in the last  168 hours. No results for input(s): "AMMONIA"  in the last 168 hours. Coagulation profile No results for input(s): "INR", "PROTIME" in the last 168 hours.  CBC: No results for input(s): "WBC", "NEUTROABS", "HGB", "HCT", "MCV", "PLT" in the last 168 hours. Cardiac Enzymes: No results for input(s): "CKTOTAL", "CKMB", "CKMBINDEX", "TROPONINI" in the last 168 hours. BNP: Invalid input(s): "POCBNP" CBG: No results for input(s): "GLUCAP" in the last 168 hours. D-Dimer No results for input(s): "DDIMER" in the last 72 hours. Hgb A1c No results for input(s): "HGBA1C" in the last 72 hours. Lipid Profile No results for input(s): "CHOL", "HDL", "LDLCALC", "TRIG", "CHOLHDL", "LDLDIRECT" in the last 72 hours. Thyroid function studies No results for input(s): "TSH", "T4TOTAL", "T3FREE", "THYROIDAB" in the last 72 hours.  Invalid input(s): "FREET3" Anemia work up No results for input(s): "VITAMINB12", "FOLATE", "FERRITIN", "TIBC", "IRON", "RETICCTPCT" in the last 72 hours. Microbiology No results found for this or any previous visit (from the past 240 hour(s)).  Time coordinating discharge: 35 minutes  Signed: Minetta Krisher  [facility] 01/26/2022, 12:01 PM

## 2022-01-26 NOTE — Progress Notes (Signed)
Inpatient Rehab Admissions Coordinator:   Per MD request pt was screened for CIR by Estill Dooms, PT, DPT.  Note admitted for syncopal episode, but workup negative for acute neurologic event.  Pt mobilizing well at supervision level, therapy recommending HH with pt in agreement.  Would not qualify for CIR at this time, and we would not be able to get insurance approval.  No consult recommended and agree with [facility] recs.   Estill Dooms, PT, DPT Admissions Coordinator [phone] 01/26/22  8:38 AM

## 2022-01-26 NOTE — Care Management Important Message (Signed)
Important Message  Patient Details  Name: Gloria Hurst MRN: [id number] Date of Birth: February 23, 1961   Medicare Important Message Given:  Yes     Giann Obara 01/26/2022, 3:30 PM

## 2022-01-26 NOTE — Progress Notes (Signed)
Discharge instructions reviewed with patient utilizing teach back method no questions at this time. Patient discharged to home. ?

## 2022-01-26 NOTE — TOC Transition Note (Signed)
Transition of Care [facility]) - CM/SW Discharge Note   Patient Details  Name: Gloria Hurst MRN: [id number] Date of Birth: 1961/06/02  [facility] CM/SW Contact:  Kermit Balo, RN Phone Number: 01/26/2022, 1:20 PM   Clinical Narrative:    Pt is discharging home with home health services through [facility]. Information on the AVS.  No DME needs.  Pt has transportation home.    Final next level of care: Home w Home Health Services Barriers to Discharge: No Barriers Identified   Patient Goals and CMS Choice   CMS Medicare.gov Compare Post Acute Care list provided to:: Patient Choice offered to / list presented to : Patient  Discharge Placement                       Discharge Plan and Services   Discharge Planning Services: CM Consult Post Acute Care Choice: Home Health                    HH Arranged: PT [facility] Agency: [facility] Date [facility] Agency Contacted: 01/24/22   Representative spoke with at [facility] Agency: Kandee Keen  Social Determinants of Health (SDOH) Interventions     Readmission Risk Interventions     No data to display

## 2023-10-04 IMAGING — CT CT ABD-PELV W/O CM
2 of 4 series · 16 of 46 positions shown, 18 images · non-contrast
Comparison: 09/10/2020

CLINICAL DATA: Seizure, abdominal pain

EXAM:
CT ABDOMEN AND PELVIS WITHOUT CONTRAST
TECHNIQUE: Multidetector CT imaging of the abdomen and pelvis was performed
following the standard protocol without IV contrast.

[Series 2: axial st · axial · 0.98mm/px · z∈[-1015,-560]mm · 13 of 99 slices shown, 15 images]
[im 4/99  soft-tissue]
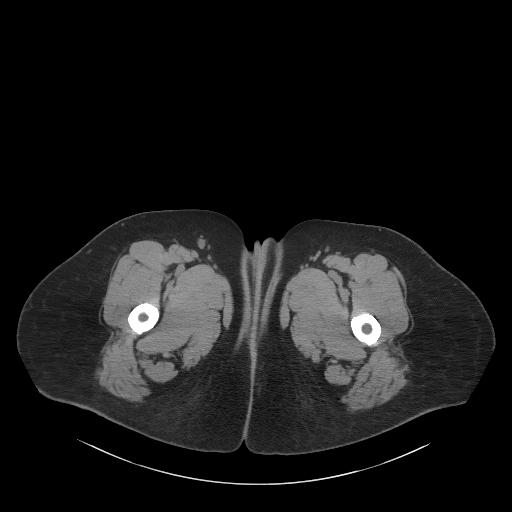
[im 4/99  bone]
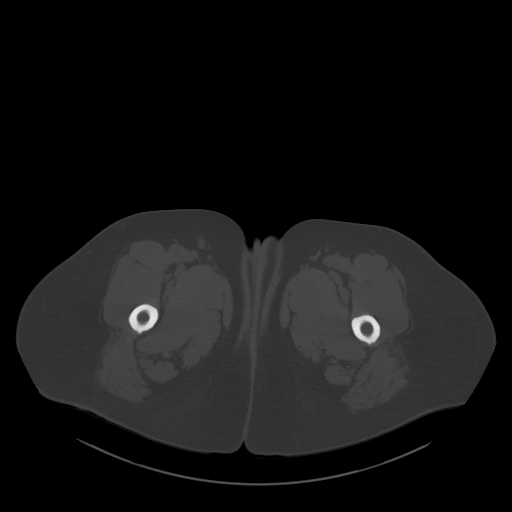
[im 12/99  soft-tissue]
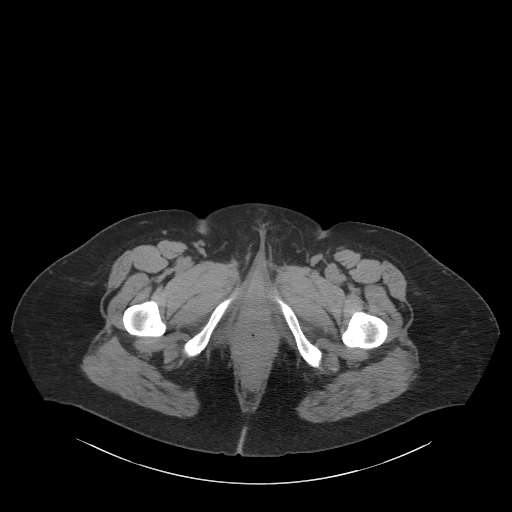
[im 20/99  soft-tissue]
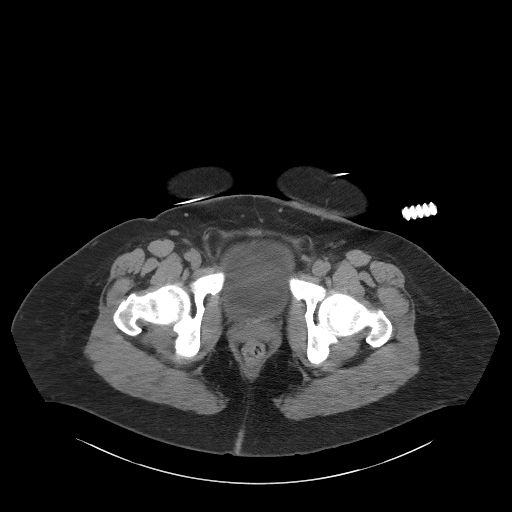
[im 28/99  soft-tissue]
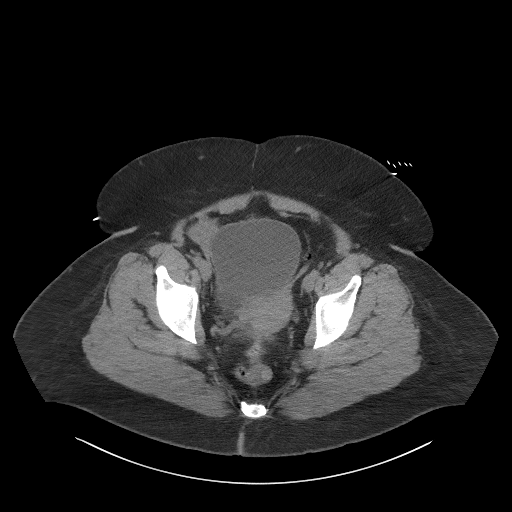
[im 36/99  soft-tissue]
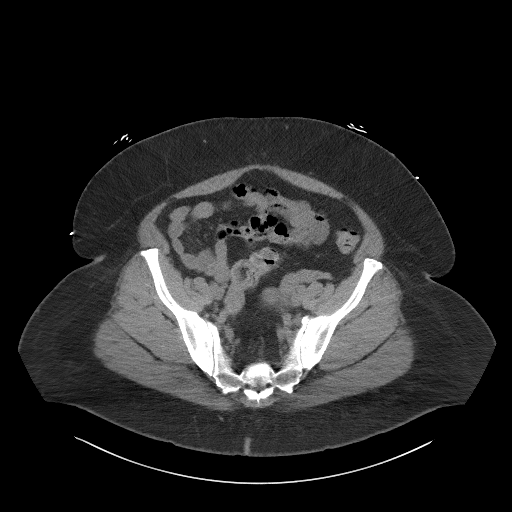
[im 44/99  soft-tissue]
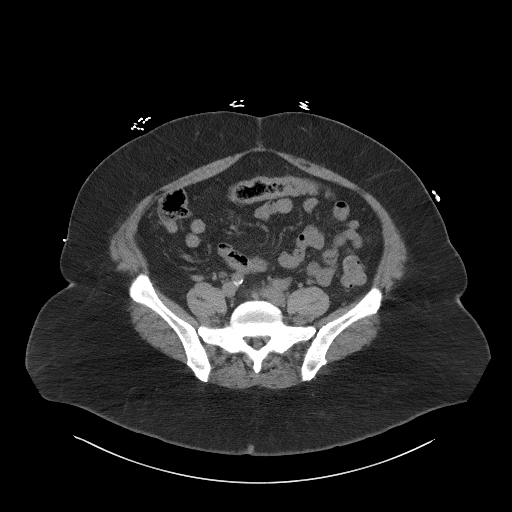
[im 51/99  soft-tissue]
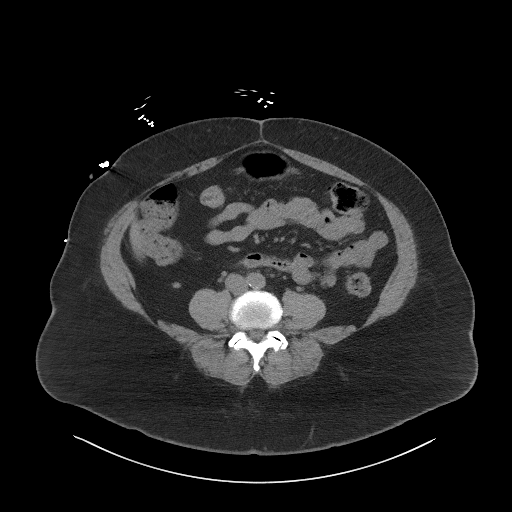
[im 55/99  soft-tissue]
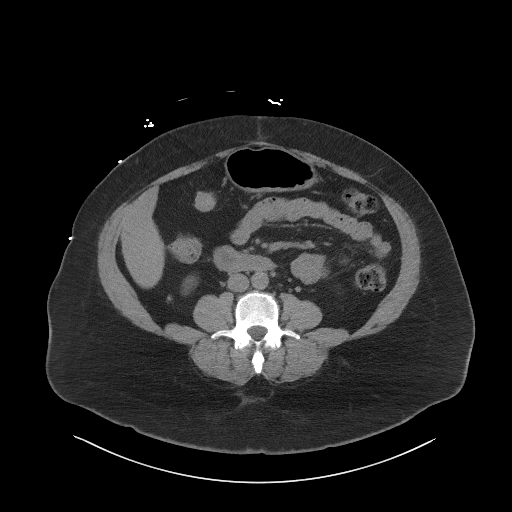
[im 63/99  soft-tissue]
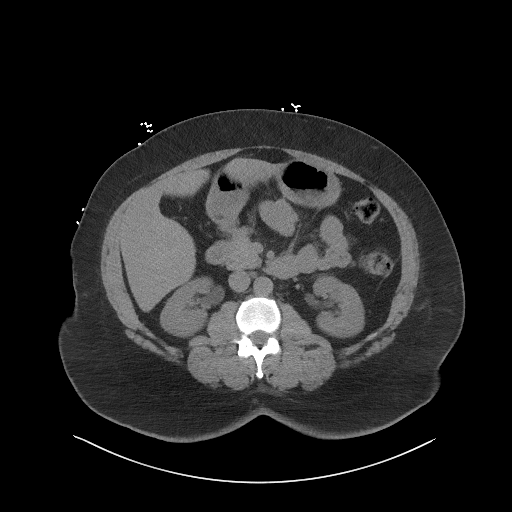
[im 63/99  bone]
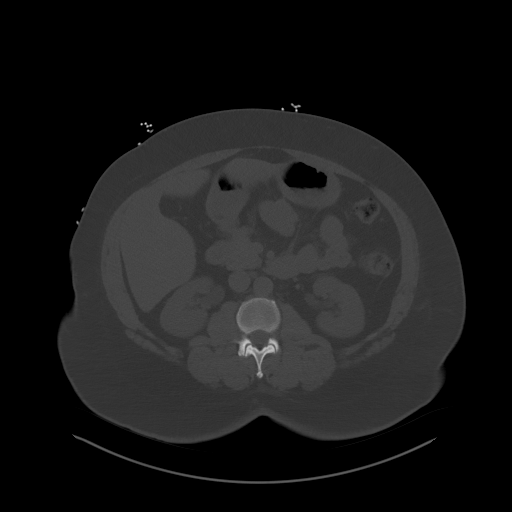
[im 71/99  soft-tissue]
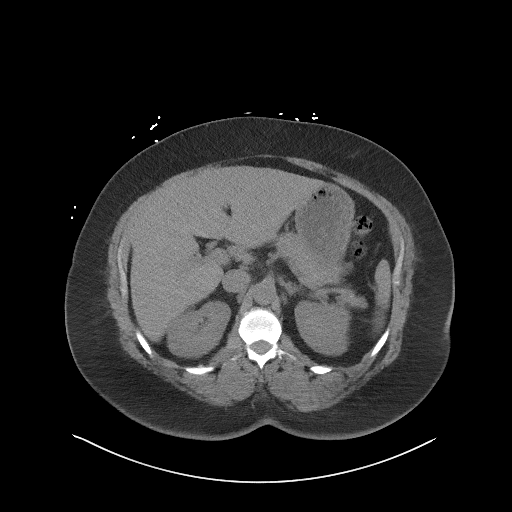
[im 79/99  soft-tissue]
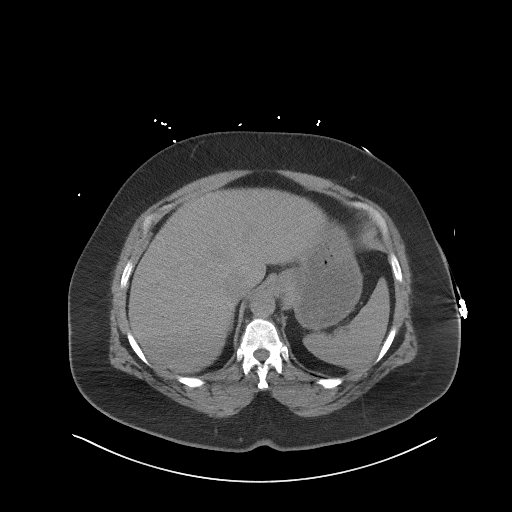
[im 87/99  soft-tissue]
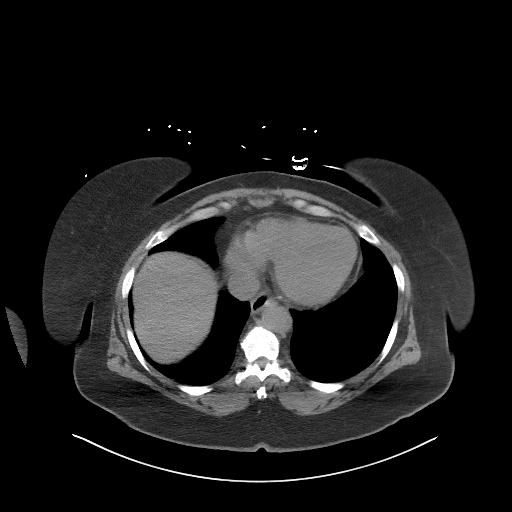
[im 95/99  soft-tissue]
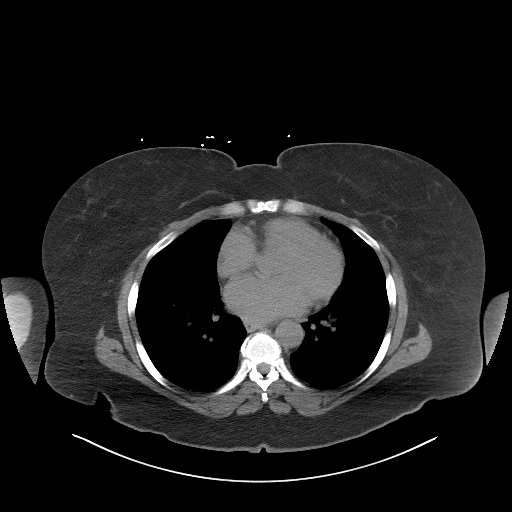

[Series 5: coronal st · coronal · 0.86mm/px · 3 of 118 slices shown]
[im 40/118  soft-tissue]
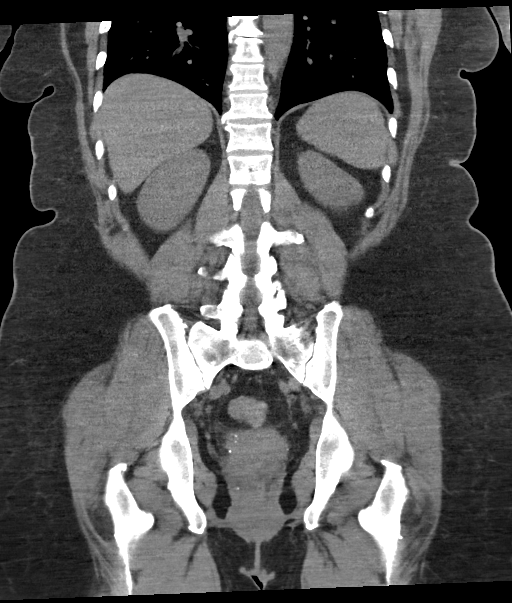
[im 53/118  soft-tissue]
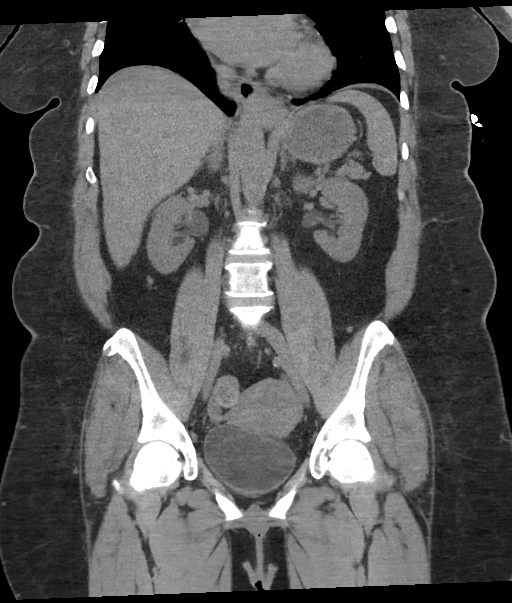
[im 66/118  soft-tissue]
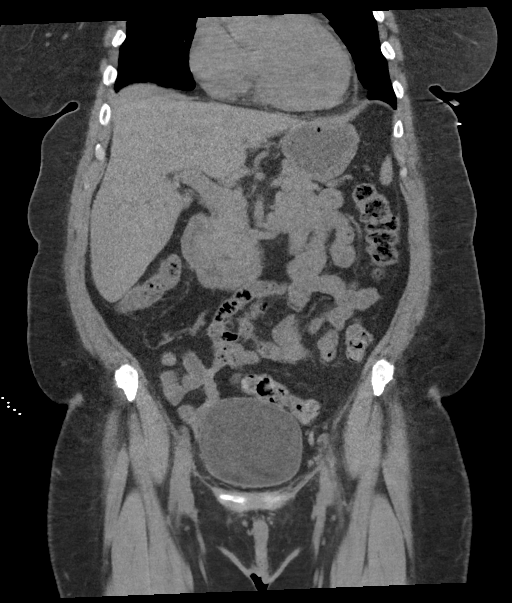

[16 of 46 positions shown; findings below may reference images not displayed]

FINDINGS: Lower chest: The visualized lung bases are clear. The visualized
heart and pericardium are unremarkable. Small hiatal hernia.

Hepatobiliary: No focal liver abnormality is seen. Status post
cholecystectomy. No biliary dilatation.

Pancreas: Unremarkable

Spleen: Unremarkable

Adrenals/Urinary Tract: Adrenal glands are unremarkable. Kidneys are
normal, without renal calculi, focal lesion, or hydronephrosis.
Bladder is unremarkable.

Stomach/Bowel: The appendix is absent. The stomach, small bowel, and
large bowel are unremarkable. No free intraperitoneal gas or fluid.

Vascular/Lymphatic: Aortic atherosclerosis. No enlarged abdominal or
pelvic lymph nodes.

Reproductive: Uterus and bilateral adnexa are unremarkable.

Other: Small bilateral fat containing inguinal hernias. The rectum
is unremarkable.

Musculoskeletal: The osseous structures are age-appropriate. No
acute bone abnormality. No lytic or blastic bone lesions.
IMPRESSION: No acute intra-abdominal pathology identified. No definite
radiographic explanation for the patient's reported symptoms.

Small hiatal hernia.

Small bilateral fat containing inguinal hernias.

Aortic Atherosclerosis ([id number]-0R1.1).

## 2023-10-04 IMAGING — CT CT CERVICAL SPINE W/O CM
3 of 4 series · 12 of 33 positions shown, 14 images · non-contrast
Comparison: 05/11/2018

CLINICAL DATA: Recent fall with seizure activity, initial encounter

EXAM:
CT HEAD WITHOUT CONTRAST
CT CERVICAL SPINE WITHOUT CONTRAST
TECHNIQUE: Multidetector CT imaging of the head and cervical spine was
performed following the standard protocol without intravenous
contrast. Multiplanar CT image reconstructions of the cervical spine
were also generated.

[Series 6: coronals · coronal · 0.23mm/px · 3 of 63 slices shown]
[im 13/63  bone]
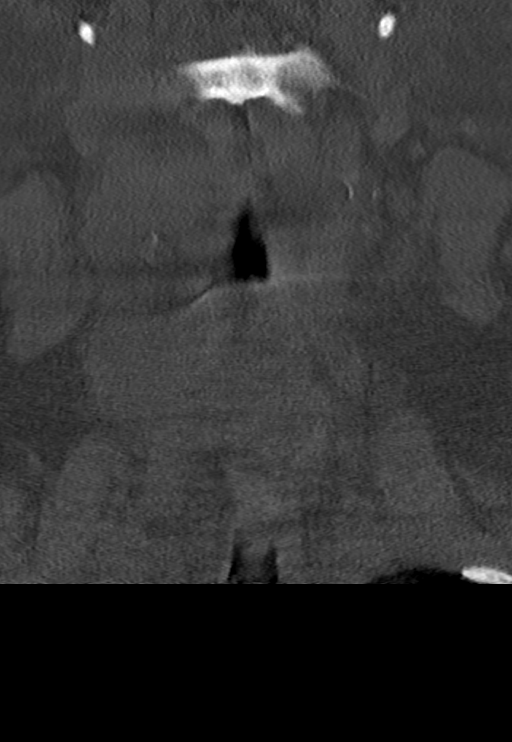
[im 25/63  bone]
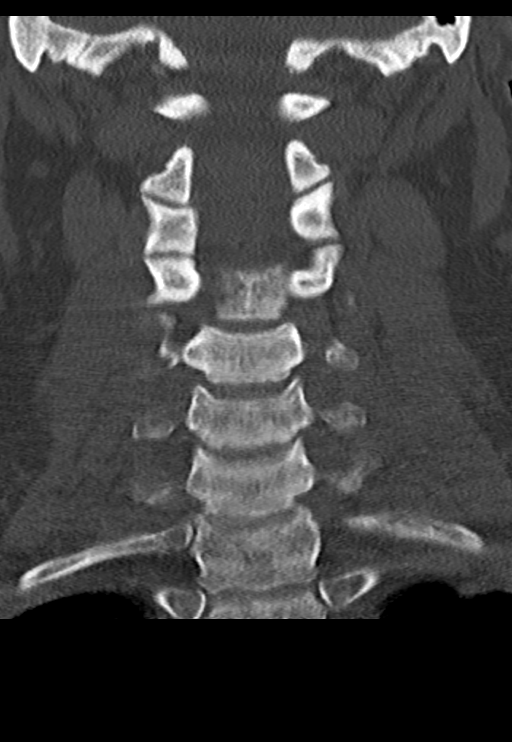
[im 38/63  bone]
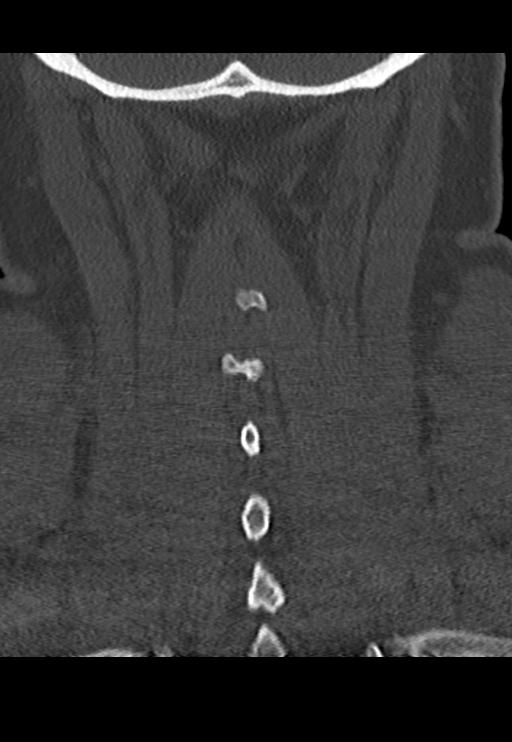

[Series 7: sagittals · sagittal · 0.25mm/px · 5 of 61 slices shown, 6 images]
[im 21/61  bone]
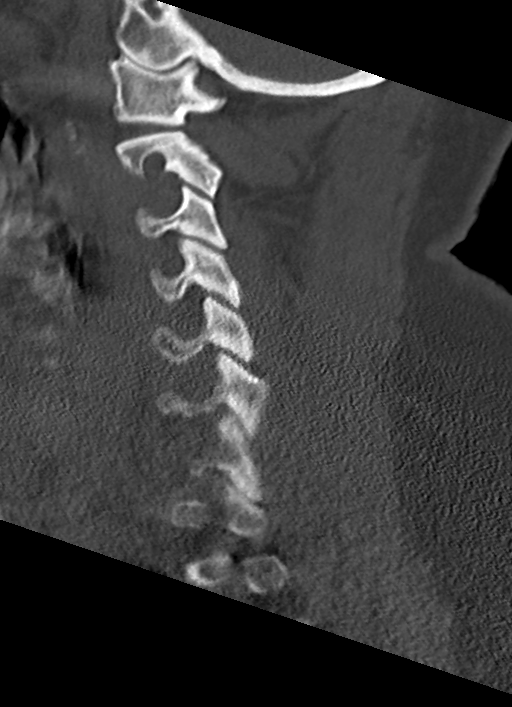
[im 26/61  bone]
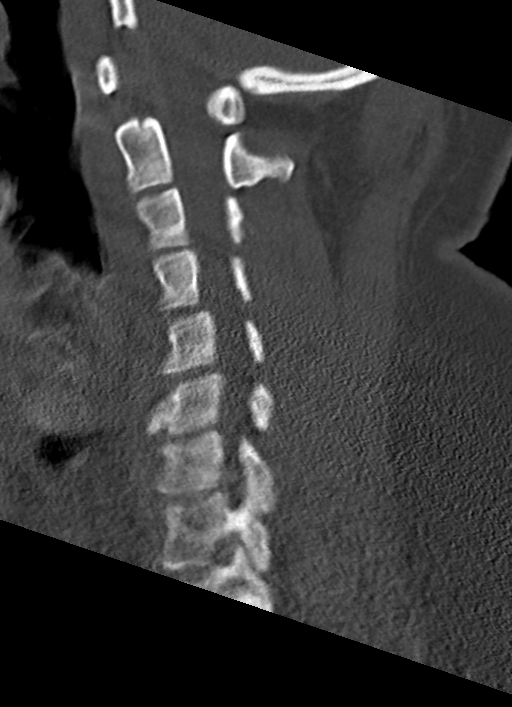
[im 31/61  soft-tissue]
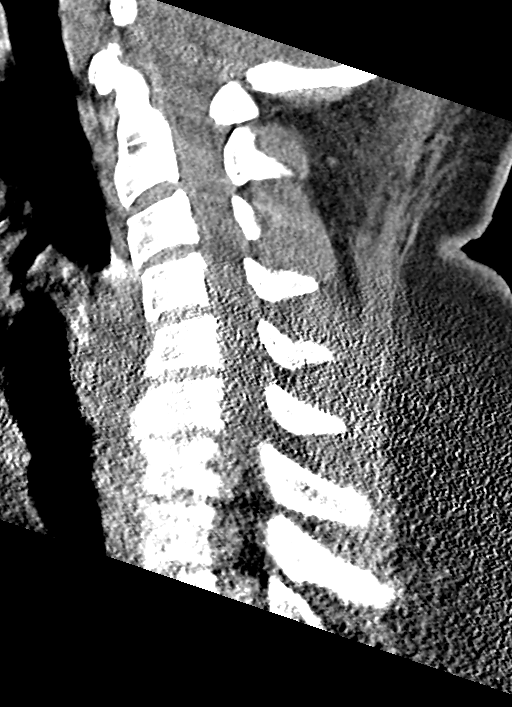
[im 31/61  bone]
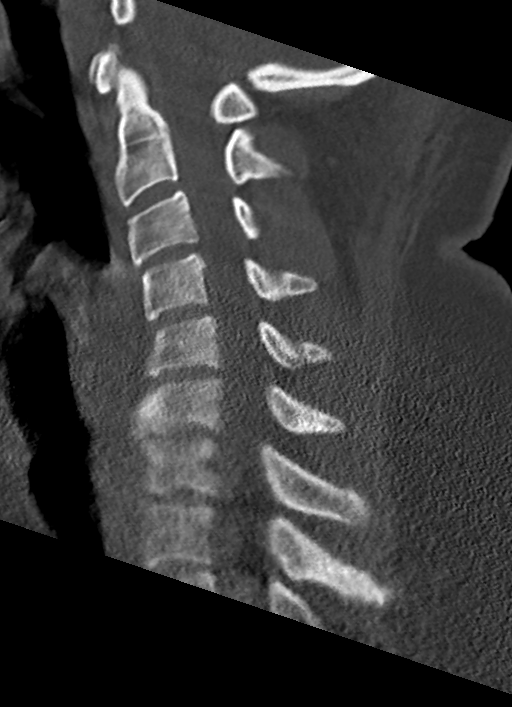
[im 36/61  bone]
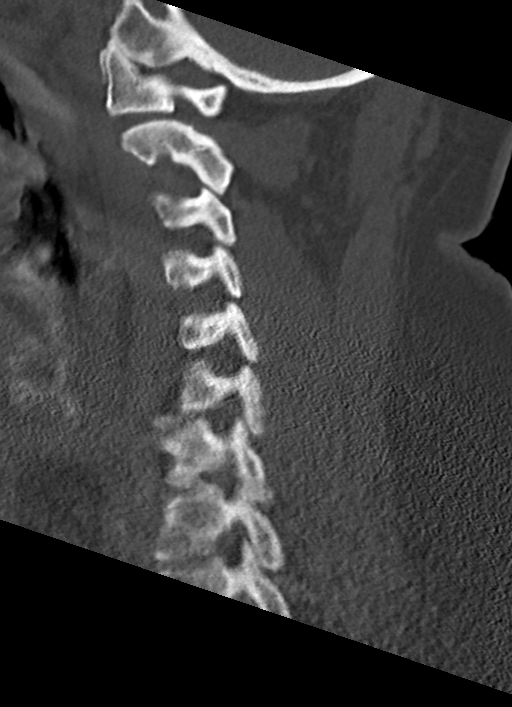
[im 41/61  bone]
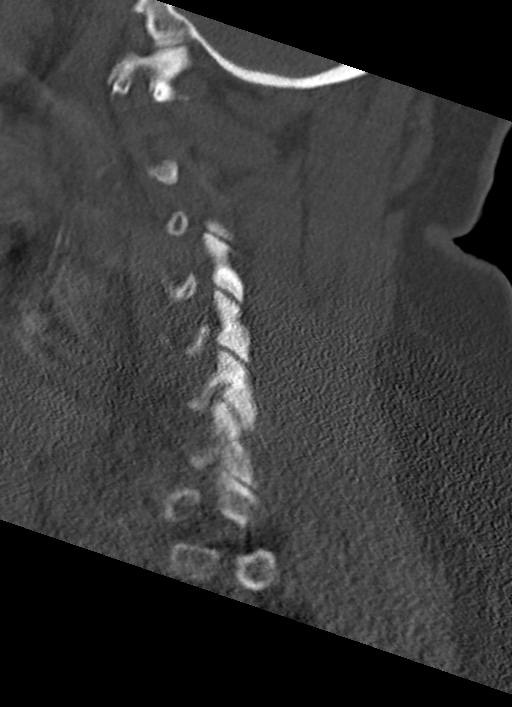

[Series 8: orthogonals · axial · 0.23mm/px · z∈[-443,-329]mm · 4 of 86 slices shown, 5 images]
[im 13/86  soft-tissue]
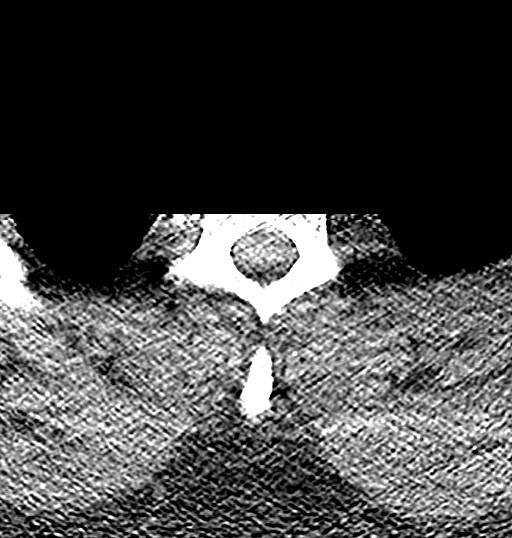
[im 13/86  bone]
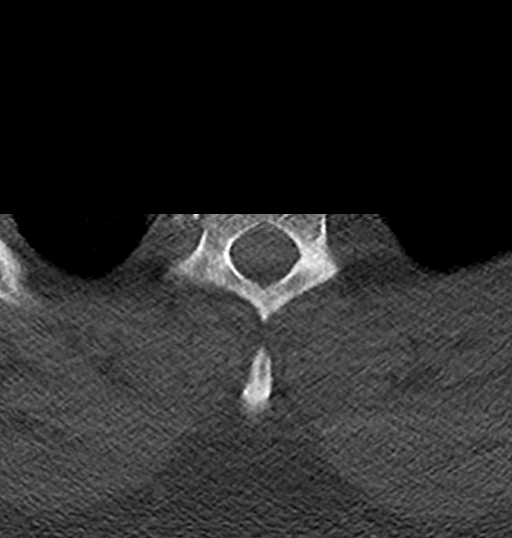
[im 37/86  bone]
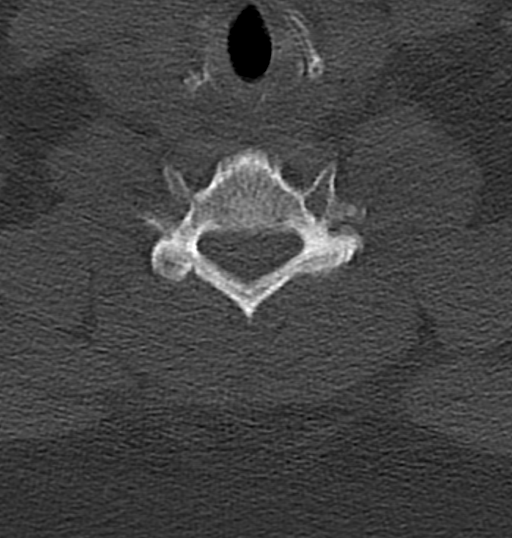
[im 49/86  bone]
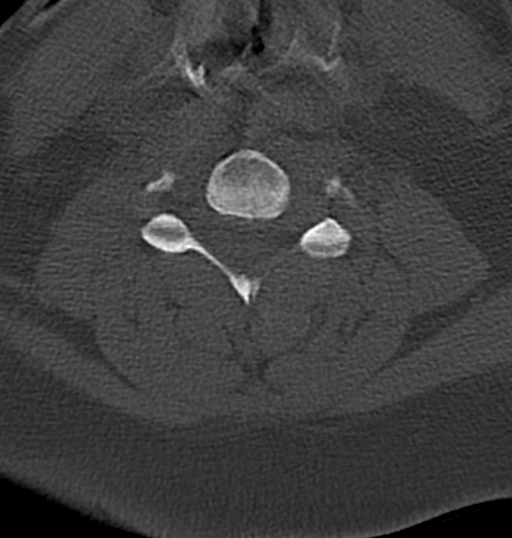
[im 73/86  bone]
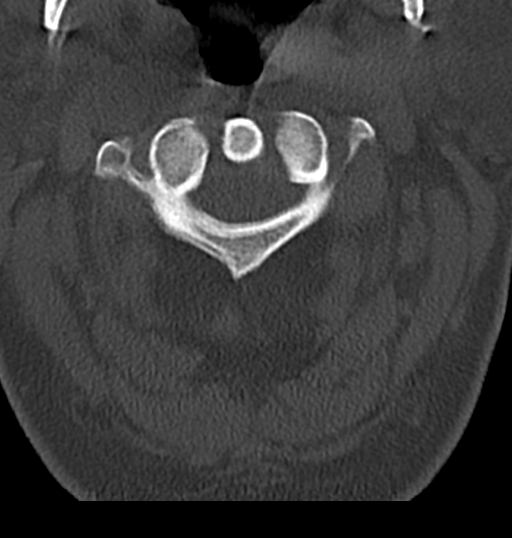

[12 of 33 positions shown; findings below may reference images not displayed]

FINDINGS: CT HEAD FINDINGS

Brain: No evidence of acute infarction, hemorrhage, hydrocephalus,
extra-axial collection or mass lesion/mass effect.

Vascular: No hyperdense vessel or unexpected calcification.

Skull: Normal. Negative for fracture or focal lesion.

Sinuses/Orbits: Mucosal retention cysts are noted within the
maxillary antra bilaterally.

Other: None

CT CERVICAL SPINE FINDINGS

Alignment: Mild loss of normal cervical lordosis is noted likely
related to muscular spasm.

Skull base and vertebrae: 7 cervical segments are well visualized.
Vertebral body height is well maintained. Osteophytic changes are
noted at C6-7. No acute fracture or acute facet abnormality is
noted. The odontoid is within normal limits.

Soft tissues and spinal canal: Surrounding soft tissue structures
are within normal limits. No focal hematoma is seen.

Upper chest: Visualized lung apices are unremarkable.

Other: None
IMPRESSION: CT of the head: No acute intracranial abnormality noted.

Mucosal changes in the sinuses.

CT of the cervical spine: Mild degenerative change without acute
abnormality.

## 2023-10-25 ENCOUNTER — Encounter (HOSPITAL_BASED_OUTPATIENT_CLINIC_OR_DEPARTMENT_OTHER): Payer: Self-pay | Admitting: Urology

## 2023-10-25 ENCOUNTER — Emergency Department (HOSPITAL_BASED_OUTPATIENT_CLINIC_OR_DEPARTMENT_OTHER)
Admission: EM | Admit: 2023-10-25 | Discharge: 2023-10-25 | Disposition: A | Discharge status: Home / Self Care | Attending: Emergency Medicine | Admitting: Emergency Medicine

## 2023-10-25 ENCOUNTER — Emergency Department (HOSPITAL_BASED_OUTPATIENT_CLINIC_OR_DEPARTMENT_OTHER)

## 2023-10-25 ENCOUNTER — Other Ambulatory Visit: Payer: Self-pay

## 2023-10-25 DIAGNOSIS — R600 Localized edema: Secondary | ICD-10-CM | POA: Insufficient documentation

## 2023-10-25 DIAGNOSIS — Z7982 Long term (current) use of aspirin: Secondary | ICD-10-CM | POA: Insufficient documentation

## 2023-10-25 LAB — COMPREHENSIVE METABOLIC PANEL WITH GFR
ALT: 26 U/L (ref 0–44)
AST: 22 U/L (ref 15–41)
Albumin: 4.5 g/dL (ref 3.5–5.0)
Alkaline Phosphatase: 101 U/L (ref 38–126)
Anion gap: 16 — ABNORMAL HIGH (ref 5–15)
BUN: 13 mg/dL (ref 8–23)
CO2: 20 mmol/L — ABNORMAL LOW (ref 22–32)
Calcium: 9.5 mg/dL (ref 8.9–10.3)
Chloride: 100 mmol/L (ref 98–111)
Creatinine, Ser: 0.77 mg/dL (ref 0.44–1.00)
GFR, Estimated: >60 mL/min (ref >60)
Glucose, Bld: 144 mg/dL — ABNORMAL HIGH (ref 70–99)
Potassium: 3.2 mmol/L — ABNORMAL LOW (ref 3.5–5.1)
Sodium: 136 mmol/L (ref 135–145)
Total Bilirubin: 0.2 mg/dL (ref 0.0–1.2)
Total Protein: 7.6 g/dL (ref 6.5–8.1)

## 2023-10-25 LAB — CBC WITH DIFFERENTIAL/PLATELET
Abs Immature Granulocytes: 0.03 10*3/uL (ref 0.00–0.07)
Basophils Absolute: 0.1 10*3/uL (ref 0.0–0.1)
Basophils Relative: 1 %
Eosinophils Absolute: 0.3 10*3/uL (ref 0.0–0.5)
Eosinophils Relative: 4 %
HCT: 39.7 % (ref 36.0–46.0)
Hemoglobin: 13.4 g/dL (ref 12.0–15.0)
Immature Granulocytes: 0 %
Lymphocytes Relative: 28 %
Lymphs Abs: 2.6 10*3/uL (ref 0.7–4.0)
MCH: 27.6 pg (ref 26.0–34.0)
MCHC: 33.8 g/dL (ref 30.0–36.0)
MCV: 81.7 fL (ref 80.0–100.0)
Monocytes Absolute: 0.6 10*3/uL (ref 0.1–1.0)
Monocytes Relative: 7 %
Neutro Abs: 5.6 10*3/uL (ref 1.7–7.7)
Neutrophils Relative %: 60 %
Platelets: 257 10*3/uL (ref 150–400)
RBC: 4.86 MIL/uL (ref 3.87–5.11)
RDW: 14.8 % (ref 11.5–15.5)
WBC: 9.2 10*3/uL (ref 4.0–10.5)
nRBC: 0 % (ref 0.0–0.2)

## 2023-10-25 LAB — PRO BRAIN NATRIURETIC PEPTIDE: Pro Brain Natriuretic Peptide: <36 pg/mL (ref <300.0)

## 2023-10-25 LAB — TROPONIN T, HIGH SENSITIVITY: Troponin T High Sensitivity: <15 ng/L (ref <19)

## 2023-10-25 MED ORDER — POTASSIUM CHLORIDE CRYS ER 20 MEQ PO TBCR
40.0000 meq | EXTENDED_RELEASE_TABLET | Freq: Once | ORAL | Status: AC
  Administered 2023-10-25: 40 meq via ORAL
  Filled 2023-10-25: qty 2

## 2023-10-25 MED ORDER — FUROSEMIDE 40 MG PO TABS
40.0000 mg | ORAL_TABLET | Freq: Once | ORAL | Status: AC
  Administered 2023-10-25: 40 mg via ORAL
  Filled 2023-10-25: qty 1

## 2023-10-25 MED ORDER — OXYCODONE HCL 5 MG PO TABS
10.0000 mg | ORAL_TABLET | Freq: Once | ORAL | Status: AC
  Administered 2023-10-25: 10 mg via ORAL
  Filled 2023-10-25: qty 2

## 2023-10-25 MED ORDER — POTASSIUM CHLORIDE CRYS ER 20 MEQ PO TBCR: 20.0000 meq | EXTENDED_RELEASE_TABLET | Freq: Every day | ORAL | 0 refills | Status: AC

## 2023-10-25 MED ORDER — FUROSEMIDE 20 MG PO TABS: ORAL_TABLET | ORAL | 0 refills | Status: AC

## 2023-10-25 MED ORDER — FUROSEMIDE 8 MG/ML PO SOLN: 40.0000 mg | Freq: Once | ORAL | Status: DC

## 2023-10-25 NOTE — Discharge Instructions (Addendum)
Follow up with your Physician  for recheck next week  

## 2023-10-25 NOTE — ED Triage Notes (Signed)
 Pt to triage in wheelchair  States bilateral lower leg swelling and pain that starteed 1 week ago  Pain worse when up walking

## 2023-10-25 NOTE — ED Notes (Signed)
 Called lab to add on troponin

## 2023-10-27 NOTE — ED Provider Notes (Signed)
 [facility] Provider Note   CSN: [id number] Arrival date & time: 10/25/23  1256     History  Chief Complaint  Patient presents with   Leg Swelling    Gloria Hurst is a 63 y.o. female.  Patient is here with her daughter.  Patient states that she went to her primary care doctor because both of her lower legs are swollen.  Patient states that her doctor was concerned that she could have blood clots in her leg.  Patient reports she has been having some cramping and discomfort in both legs and her right arm.  Patient is concerned about her heart.  Patient reports swelling began a week ago.  She has not had any changes in her diet she has not had any changes in her activity.  Patient denies any recent prolonged travel.  She is not on any hormone therapy.  She has not had any blood clots in the past.  Patient does not have any history of cancer.  Patient denies any history of congestive heart failure.  She has not had any renal disease.  Patient states that she is followed by cardiology.  And primary care.  She reports she used to be on a fluid pill but her cardiologist stopped this.  The history is provided by the patient and a relative. No language interpreter was used.       Home Medications Prior to Admission medications   Medication Sig Start Date End Date Taking? Authorizing Provider  furosemide  (LASIX ) 20 MG tablet One tablet a day 10/25/23  Yes Kelcey Wickstrom K, PA-C  potassium chloride  SA (KLOR-CON  M) 20 MEQ tablet Take 1 tablet (20 mEq total) by mouth daily. 10/25/23  Yes Sandi Crosby, PA-C  acetaZOLAMIDE  (DIAMOX ) 250 MG tablet Take 2 tablets (500 mg total) by mouth 2 (two) times daily. 01/26/22 04/26/22  Hoyt Macleod, MD  albuterol  (PROVENTIL  HFA) 108 (90 Base) MCG/ACT inhaler Inhale 2 puffs into the lungs every 6 (six) hours as needed for wheezing or shortness of breath.     [provider]  aspirin  EC 81 MG EC tablet Take 1  tablet (81 mg total) by mouth daily. 01/08/18   Singh, Prashant K, MD  budesonide-formoterol  [facility]) 80-4.5 MCG/ACT inhaler Inhale 2 puffs into the lungs at bedtime.    [provider]  cholecalciferol  (VITAMIN D) 400 units TABS tablet Take 400 Units by mouth daily.    [provider]  dexlansoprazole (DEXILANT) 60 MG capsule Take 60 mg by mouth daily.    [provider]  diltiazem  (CARDIZEM  CD) 180 MG 24 hr capsule Take 1 capsule (180 mg total) by mouth daily. 01/27/22 04/27/22  Hoyt Macleod, MD  losartan  (COZAAR ) 100 MG tablet Take 100 mg by mouth daily. 11/02/21   [provider]  montelukast  (SINGULAIR ) 10 MG tablet Take 10 mg by mouth at bedtime.    [provider]  Multiple Vitamin (MULTIVITAMIN WITH MINERALS) TABS tablet Take 1 tablet by mouth daily. Centrum    [provider]  PRESCRIPTION MEDICATION Inhale into the lungs at bedtime. CPAP    [provider]  rosuvastatin  (CRESTOR ) 20 MG tablet Take 1 tablet (20 mg total) by mouth daily. 01/07/18   Singh, Prashant K, MD      Allergies    Bee venom, Strawberry extract, and Penicillins    Review of Systems   Review of Systems  Cardiovascular:  Positive for leg swelling.  All other  systems reviewed and are negative.   Physical Exam Updated Vital Signs BP (!) 146/80 (BP Location: Right Wrist)   Pulse 64   Temp 97.6 F (36.4 C) (Oral)   Resp 20   Ht 5\' 2"  (1.575 m)   Wt 99.8 kg   LMP 03/26/2012   SpO2 97%   BMI 40.24 kg/m  Physical Exam Vitals and nursing note reviewed.  Constitutional:      Appearance: She is well-developed.  HENT:     Head: Normocephalic.     Mouth/Throat:     Mouth: Mucous membranes are moist.  Cardiovascular:     Rate and Rhythm: Normal rate.  Pulmonary:     Effort: Pulmonary effort is normal.  Abdominal:     General: There is no distension.  Musculoskeletal:        General: Swelling and tenderness present.     Cervical back: Normal  range of motion.     Comments: Swelling bilateral lower extremities negative Homans  Skin:    General: Skin is warm.  Neurological:     General: No focal deficit present.     Mental Status: She is alert and oriented to person, place, and time.     ED Results / Procedures / Treatments   Labs (all labs ordered are listed, but only abnormal results are displayed) Labs Reviewed  COMPREHENSIVE METABOLIC PANEL WITH GFR - Abnormal; Notable for the following components:      Result Value   Potassium 3.2 (*)    CO2 20 (*)    Glucose, Bld 144 (*)    Anion gap 16 (*)    All other components within normal limits  CBC WITH DIFFERENTIAL/PLATELET  PRO BRAIN NATRIURETIC PEPTIDE  TROPONIN T, HIGH SENSITIVITY    EKG EKG Interpretation Date/Time:  Friday October 25 2023 15:46:54 EDT Ventricular Rate:  63 PR Interval:  136 QRS Duration:  94 QT Interval:  388 QTC Calculation: 398 R Axis:   41  Text Interpretation: Ectopic atrial rhythm Nonspecific T abnormalities, inferior leads Confirmed by Owen Blowers ([id number]) on 10/25/2023 6:04:42 PM  Radiology US  Venous Img Lower Bilateral Result Date: 10/25/2023 CLINICAL DATA:  Bilateral ankle pain and swelling for a week EXAM: BILATERAL LOWER EXTREMITY VENOUS DOPPLER ULTRASOUND TECHNIQUE: Gray-scale sonography with graded compression, as well as color Doppler and duplex ultrasound were performed to evaluate the lower extremity deep venous systems from the level of the common femoral vein and including the common femoral, femoral, profunda femoral, popliteal and calf veins including the posterior tibial, peroneal and gastrocnemius veins when visible. The superficial great saphenous vein was also interrogated. Spectral Doppler was utilized to evaluate flow at rest and with distal augmentation maneuvers in the common femoral, femoral and popliteal veins. COMPARISON:  None Available. FINDINGS: RIGHT LOWER EXTREMITY Common Femoral Vein: No evidence of thrombus. Normal  compressibility, respiratory phasicity and response to augmentation. Saphenofemoral Junction: No evidence of thrombus. Normal compressibility and flow on color Doppler imaging. Profunda Femoral Vein: No evidence of thrombus. Normal compressibility and flow on color Doppler imaging. Femoral Vein: No evidence of thrombus. Normal compressibility, respiratory phasicity and response to augmentation. Popliteal Vein: No evidence of thrombus. Normal compressibility, respiratory phasicity and response to augmentation. Calf Veins: No evidence of thrombus. Normal compressibility and flow on color Doppler imaging. Superficial Great Saphenous Vein: No evidence of thrombus. Normal compressibility. Venous Reflux:  None. Other Findings:  Mild soft tissue edema. LEFT LOWER EXTREMITY Common Femoral Vein: No evidence of thrombus. Normal compressibility, respiratory phasicity  and response to augmentation. Saphenofemoral Junction: No evidence of thrombus. Normal compressibility and flow on color Doppler imaging. Profunda Femoral Vein: No evidence of thrombus. Normal compressibility and flow on color Doppler imaging. Femoral Vein: No evidence of thrombus. Normal compressibility, respiratory phasicity and response to augmentation. Popliteal Vein: No evidence of thrombus. Normal compressibility, respiratory phasicity and response to augmentation. Calf Veins: No evidence of thrombus. Normal compressibility and flow on color Doppler imaging. Superficial Great Saphenous Vein: No evidence of thrombus. Normal compressibility. Venous Reflux:  None. Other Findings:  Mild soft tissue edema. IMPRESSION: No evidence of deep venous thrombosis in either lower extremity. Electronically Signed   By: Adrianna Horde M.D.   On: 10/25/2023 17:32   DG Chest 2 View Result Date: 10/25/2023 CLINICAL DATA:  Shortness of breath. EXAM: CHEST - 2 VIEW COMPARISON:  January 18, 2022. FINDINGS: The heart size and mediastinal contours are within normal limits. Both lungs  are clear. The visualized skeletal structures are unremarkable. IMPRESSION: No active cardiopulmonary disease. Electronically Signed   By: Rosalene Colon M.D.   On: 10/25/2023 16:37    Procedures Procedures    Medications Ordered in ED Medications  oxyCODONE  (Oxy IR/ROXICODONE ) immediate release tablet 10 mg (10 mg Oral Given 10/25/23 1547)  potassium chloride  SA (KLOR-CON  M) CR tablet 40 mEq (40 mEq Oral Given 10/25/23 1828)  furosemide  (LASIX ) tablet 40 mg (40 mg Oral Given 10/25/23 1828)    ED Course/ Medical Decision Making/ A&P                                 Medical Decision Making Patient complains of swelling bilateral lower extremities she is worried about DVTs  Amount and/or Complexity of Data Reviewed Independent Historian:     Details: Patient is  here with her daughter who is concerned the patient could have DVTs. External Data Reviewed: notes.    Details: Previous notes are reviewed Labs: ordered. Decision-making details documented in ED Course.    Details: Labs ordered reviewed and interpreted Bnp is normal potassium is slightly low at 3.2 Radiology: ordered and independent interpretation performed.    Details: Chest x-ray shows no cardiopulmonary disease Bilateral vascular ultrasounds are normal. ECG/medicine tests: ordered and independent interpretation performed. Decision-making details documented in ED Course.    Details: EKG ordered reviewed and interpreted no acute findings  Risk Prescription drug management. Risk Details: Patient does not show any signs of congestive heart failure no DVTs in lower extremities.  I suspect patient just has dependent peripheral edema discussed elevation compression hose.  Patient is given Lasix  to help with swelling.  Patient is also given potassium as I have advised her to have her physician recheck her potassium.  Patient is advised to follow-up with her primary care physician next week for recheck return to the emergency  department if any problems.           Final Clinical Impression(s) / ED Diagnoses Final diagnoses:  Peripheral edema    Rx / DC Orders ED Discharge Orders          Ordered    potassium chloride  SA (KLOR-CON  M) 20 MEQ tablet  Daily        10/25/23 1815    furosemide  (LASIX ) 20 MG tablet        10/25/23 1815           An After Visit Summary was printed and given to the patient.  Sandi Crosby, PA-C 10/27/23 1019    Owen Blowers P, DO 11/02/23 1521

## 2023-10-31 NOTE — Progress Notes (Signed)
 10/31/23   [facility] [place] Visit Note     CHIEF COMPLAINT Patient presents for Eye Exam   HISTORY OF PRESENT ILLNESS: Gloria Hurst is a 63 y.o. female who presents to the clinic today for:   HPI     Eye Exam   I, the attending physician, performed the HPI with the patient and updated the documentation appropriately.  The patient is here for a return visit.        Comments   LV: 09/27/2022  Patient here for 1 YR CEE.  Patient states vision is stable and functions well with current glasses at distance and near. Did not bring today.  No complaints of glare, halos or starbursts around lights at night.  Comfort of eyes is dry and watery due to eye allergies. No drops or ointment.  States taking OTC allergy medication po helps comfort of eyes.   Denies eye pain, redness, or photophobia. Denies flashes, new floaters or dark curtain/veil.   Denies DM. 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  BS= stays around 110-118 Unsure what last A1C was.       Last edited by Lynwood Arthea Plover, MD on 10/31/2023  4:14 PM.      CURRENT MEDICATIONS: Current Outpatient Medications on File Prior to Visit  Medication Sig Dispense Refill  . albuterol  HFA (PROVENTIL  HFA;VENTOLIN  HFA;PROAIR  HFA) 90 mcg/actuation inhaler Inhale 2 puffs every 6 (six) hours as needed.    . amLODIPine  (NORVASC ) 10 mg tablet Take 1 tablet by mouth daily.    . aspirin  81 mg chewable tablet Take 81 mg by mouth Once Daily.    . cholecalciferol  (VITAMIN D3) 400 unit (10 mcg) tablet Take 400 Units by mouth.    . cyanocobalamin (VITAMIN B12) 1,000 mcg tablet Take 1,000 mcg by mouth daily. gummy    . dexlansoprazole (DEXILANT) 60 mg DR capsule Take 60 mg by mouth.    . dilTIAZem  (TIAZAC ) 300 mg Cs24 24 hr capsule Take 300 mg by mouth daily.    . ibuprofen  (MOTRIN ) 800 mg tablet Take 800 mg by mouth every 8 (eight) hours as needed.    . montelukast  (SINGULAIR ) 10 mg tablet Take 10 mg  by mouth at bedtime.    . multivitamin-iron-minerals-folic acid (Thera-M) 27-0.4 mg tab Take 1 tablet by mouth daily.    SABRA olmesartan-hydroCHLOROthiazide  40-12.5 mg tab Take 1 tablet by mouth daily.    . oxyCODONE  (ROXICODONE ) 15 mg immediate release tablet Take 15 mg by mouth every 6 (six) hours as needed.    . phenytoin  (DILANTIN ) 100 mg ER capsule Take 200 mg by mouth daily.    . psyllium seed (METAMUCIL SUGAR FREE ORAL) Take by mouth daily. gummy    . rosuvastatin  (CRESTOR ) 20 mg tablet Take 20 mg by mouth daily.    . Symbicort 80-4.5 mcg/actuation inhaler INL 2 PFS INTO THE LUNGS BID    . tiZANidine  (ZANAFLEX ) 2 mg tablet TAKE 1 TABLET BY MOUTH EVERY 8 HOURS AS NEEDED FOR BACK PAIN OR SPASM    . topiramate (TOPAMAX) 50 mg tablet Start with 25 mg at night x 1 week, then go to 50 mg at night x 1 week, then 75 mg at night x 1 week and finally 100 mg at night 60 tablet 1  . vitamin E 180 mg (400 unit) capsule Take 400 Units by mouth daily.    . potassium chloride  (KLOR-CON ) 20 mEq ER tablet Take 1 tablet (20 mEq  total) by mouth 2 (two) times a day for 10 days. 20 tablet 0   No current facility-administered medications on file prior to visit.    Referring physician: No referring provider defined for this encounter.  ALLERGIES Allergies  Allergen Reactions  . Strawberry Hives  . Venom-Honey Bee Anaphylaxis and Shortness Of Breath    SOB, SOB  . Penicillins Rash and Flushing    Has patient had a PCN reaction causing immediate rash, facial/tongue/throat swelling, SOB or lightheadedness with hypotension: Yes, Has patient had a PCN reaction causing severe rash involving mucus membranes or skin necrosis: No, Has patient had a PCN reaction that required hospitalization: No, Has patient had a PCN reaction occurring within the last 10 years: No, If all of the above answers are NO, then may proceed with Cephalosporin use.  SABRA Lisinopril Itching    PAST MEDICAL HISTORY Past Medical History:   Diagnosis Date  . Arthritis   . Asthma (CMD)   . Delayed emergence from general anesthesia    a long time ago after colonscopy  . GERD (gastroesophageal reflux disease)   . High cholesterol   . Hypertension with goal to be determined   . Sleep apnea    c-pap  . Stroke    (CMD) 2019   Past Surgical History:  Procedure Laterality Date  . APPENDECTOMY     Procedure: APPENDECTOMY  . CESAREAN SECTION, UNSPECIFIED     Procedure: CESAREAN SECTION  . CHOLECYSTECTOMY    . COLONOSCOPY    . INCISIONAL HERNIA REPAIR N/A 05/24/2023   REPAIR HERNIA INCISIONAL WITH MESH ROBOTIC XI performed by Prentice Tanda Pizza, MD at [facility] OR  . REDUCTION MAMMAPLASTY Bilateral    Procedure: REDUCTION MAMMAPLASTY; 2003  . UPPER GASTROINTESTINAL ENDOSCOPY  2024   no problem waking up after colonoscopy    FAMILY HISTORY Family History  Problem Relation Name Age of Onset  . Early death Mother  20       heart attack  . Diabetes Mother    . Hypertension Mother    . Heart attack Mother    . Stroke Father    . Heart attack Sister    . Heart attack Brother    . Early death Brother  23       heart attack  . Colon cancer Maternal Aunt    . Colon cancer Cousin    . Colon cancer Cousin    . Glaucoma Neg Hx    . Macular degeneration Neg Hx      SOCIAL HISTORY Social History   Tobacco Use  . Smoking status: Every Day    Current packs/day: 0.25    Types: Cigarettes  . Smokeless tobacco: Never  Substance Use Topics  . Alcohol use: Not Currently    Comment: none in 4 days  . Drug use: Never        OPHTHALMIC EXAM:  Base Eye Exam     Visual Acuity (Snellen - Linear)       Right Left   Dist  20/25 +1 20/20 -2  Encouraged not to squint to read chart.         Tonometry ([facility], 3:36 PM)       Right Left   Pressure 13 12  Unable to get applanation drops in both eyes. Pt squeezing and pulling away.        Pupils       Pupils APD   Right PERRL None   Left PERRL None  Visual Fields (Counting fingers)       Left Right    Full Full         Extraocular Movement       Right Left    Full Full         Neuro/Psych     Oriented x3: Yes   Mood/Affect: Normal         Dilation     Both eyes: 2.5% Phenylephrine, 1.0% Tropicamide @ 3:37 PM           Additional Tests     Glare Testing (Transilluminator)       High   Right 20/20   Left 20/20           Slit Lamp and Fundus Exam     External Exam       Right Left   External Normal Normal         Slit Lamp Exam       Right Left   Lids/Lashes Normal Normal   Conjunctiva/Sclera White and quiet White and quiet   Cornea Clear Clear   Anterior Chamber Deep and quiet Deep and quiet   Iris dilated dilated   Lens Trace NSC Trace NSC         Fundus Exam       Right Left   Posterior Vitreous Vitreous floaters Vitreous floaters   Disc Normal Normal   C/D Ratio 0.4 0.4   Macula Normal Normal   Vessels Normal Normal   Periphery Normal Normal           Refraction     Wearing Rx     Type: did not bring         Manifest Refraction       Sphere Cylinder Axis Dist VA Add Near [place]   Right +1.00 -0.75 101 20/20 +2.25 J1+   Left +0.75 -1.00 097 20/20 +2.25 J1+         Manifest Refraction #2 (Auto)       Sphere Cylinder Axis Dist VA Add Near [place]   Right +1.00 -0.50 085      Left +0.50 -0.75 101            Manifest Refraction Comments   Preferred lower add OU        Final Rx       Sphere Cylinder Axis Dist VA Add Near [place]   Right +1.00 -0.75 101 20/20 +2.25 J1+   Left +0.75 -1.00 097 20/20 +2.25 J1+    Expiration Date: 10/30/2025  Remark: UV filter, Polycarbonate, Tint of choice Doctor Recommendations:Anti-Reflective              IMAGING AND PROCEDURES:   ASSESSMENT/PLAN:  1. 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 Nuclear sclerotic cataract of both eyes      3. Vitreous floaters of both eyes         Refractive error OU: New glasses RX given to patient today.   Cataract OU: presurgical, discussed, patient happy with best corrected vision, continue to monitor.   Floaters OU: no retinal tears or retinal detachment. Retinal Detachment precautions discussed with patient.   Medication ordered this visit:  No orders of the defined types were placed in this encounter.   Return in about 1 year (around 10/30/2024) for CEE.  Patient Instructions  Keep scheduled appointment, call sooner if needed.    Explained the diagnoses, plan, and follow up with the  patient and they expressed understanding.  Patient expressed understanding of the importance of proper follow up care.    Abbreviations: M myopia (nearsighted); A astigmatism; H hyperopia (farsighted); P presbyopia; Mrx spectacle prescription;  CTL contact lenses; OD right eye; OS left eye; OU both eyes  XT exotropia; ET esotropia; PEK punctate epithelial keratitis; PEE punctate epithelial erosions; DES dry eye syndrome; MGD meibomian gland dysfunction; ATs artificial tears; PFAT's preservative free artificial tears; NSC nuclear sclerotic cataract; PSC posterior subcapsular cataract; ERM epi-retinal membrane; PVD posterior vitreous detachment; RD retinal detachment; DM diabetes mellitus; DR diabetic retinopathy; NPDR non-proliferative diabetic retinopathy; PDR proliferative diabetic retinopathy; CSME clinically significant macular edema; DME diabetic macular edema; dbh dot blot hemorrhages; CWS cotton wool spot; POAG primary open angle glaucoma; C/D cup-to-disc ratio; HVF humphrey visual field; GVF goldmann visual field; OCT optical coherence tomography; IOP intraocular pressure; BRVO Branch retinal vein occlusion; CRVO central retinal vein occlusion; CRAO central retinal artery occlusion; BRAO branch retinal artery occlusion; RT retinal tear; SB scleral buckle; PPV pars plana vitrectomy; VH Vitreous hemorrhage; PRP panretinal laser photocoagulation;  IVK intravitreal kenalog; VMT vitreomacular traction; MH Macular hole;  NVD neovascularization of the disc; NVE neovascularization elsewhere; AREDS age related eye disease study; ARMD age related macular degeneration; POAG primary open angle glaucoma; EBMD epithelial/anterior basement membrane dystrophy; ACIOL anterior chamber intraocular lens; IOL intraocular lens; PCIOL posterior chamber intraocular lens; Phaco/IOL phacoemulsification with intraocular lens placement; PRK photorefractive keratectomy; LASIK laser assisted in situ keratomileusis; HTN hypertension; DM diabetes mellitus; COPD chronic obstructive pulmonary disease  This document serves as a record of services personally performed by Lynwood Arthea Plover, MD .  It was created on their behalf by Olam Slade Prevatte, COT, a trained medical scribe, and Certified Ophthalmic Tech (COT). During the course of documenting the history, physical exam and medical decision making, I was functioning as a Stage manager. The creation of this record is the provider's dictation and/or activities during the visit.  Electronically signed by Olam Slade Prevatte, COT 10/31/2023 4:10 PM  Electronically signed by: Lynwood Arthea Plover, MD 10/31/2023 4:14 PM

## 2023-11-29 NOTE — Progress Notes (Signed)
 Outpatient Cardiology Clinic   Cardiology New Patient Consultation  Consulting Cardiologist:  Elsie Gilmore Loreli Mickey, MD, [facility] Primary Care Provider: Arnulfo Jenna Sharps, MD  Reason for Visit/Chief Complaint:  Chief Complaint  Patient presents with  . New Patient    C/o BP issues leg swelling x 60mo    History of Present Illness, Interval History, Medication, and PMH Review:   HPI:  Ms. Gloria Hurst is a friendly 63 y.o. female who previously was seen by me at [facility].  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  She is known in the past to be very easily confused by her medical regimen or at times just being noncompliant.  She presents now with bilateral lower leg swelling which is mild with recent Doppler study in April 2025 showing no evidence of DVT only soft tissue edema.  Echocardiogram of January 20, 2022 shows EF of 60% to 65% with no focal wall motion abnormality and mild LVH with grade 1 diastolic dysfunction.  There was normal right ventricular size and function with mild left atrial enlargement and normal cardiac valve structure and function.  Patient denies having chest pain, shortness of breath, palpitations, dizziness or syncope.  Over time her blood pressure readings have not been ideally controlled with sequential readings starting on October 2024, November 2024, December 2024 and January 2025 read as follows 168/84 heart rate 85 bpm, 142/59 mmHg heart rate 63/min, 187/104 mmHg heart rate 69 bpm and 164/86 mmHg with heart rate of 79 bpm.  Today Nov 29, 2023 blood pressure is 149/84 mmHg with heart rate of 75 bpm.  Patient does actively smoke 3 to 5 cigarettes a day and is known to have obstructive sleep apnea.  Laboratories of November 2024 show hemoglobin 13.5, sodium 135, potassium 3.6, BUN 11, creatinine 0.73.  Past Medical History:  Past Medical History:  Diagnosis Date  . Arthritis    . Asthma (CMD)   . Delayed emergence from general anesthesia    a long time ago after colonscopy  . GERD (gastroesophageal reflux disease)   . High cholesterol   . Hypertension with goal to be determined   . Sleep apnea    c-pap  . Stroke    (CMD) 2019    Past Surgical History:  Procedure Laterality Date  . APPENDECTOMY     Procedure: APPENDECTOMY  . CESAREAN SECTION, UNSPECIFIED     Procedure: CESAREAN SECTION  . CHOLECYSTECTOMY    . COLONOSCOPY    . INCISIONAL HERNIA REPAIR N/A 05/24/2023   REPAIR HERNIA INCISIONAL WITH MESH ROBOTIC XI performed by Prentice Tanda Pizza, MD at [facility] OR  . REDUCTION MAMMAPLASTY Bilateral    Procedure: REDUCTION MAMMAPLASTY; 2003  . UPPER GASTROINTESTINAL ENDOSCOPY  2024   no problem waking up after colonoscopy    Review of Systems:   Review of Systems  Constitutional: Negative for fever and weight loss.  HENT:  Negative for nosebleeds.   Eyes:  Negative for visual disturbance.  Cardiovascular:  Positive for leg swelling. Negative for chest pain, claudication, dyspnea on exertion, palpitations and syncope.  Respiratory:  Negative for cough, shortness of breath and wheezing.   Endocrine: Negative for polyuria.  Hematologic/Lymphatic: Negative for adenopathy.  Skin:  Negative for rash.  Musculoskeletal:  Negative for joint pain and muscle weakness.  Gastrointestinal:  Negative for abdominal pain, constipation, hematemesis and nausea.  Neurological:  Negative for focal weakness  and paresthesias.  Psychiatric/Behavioral:  Negative for depression.     Current Medications and Allergies:  Current Outpatient Medications  Medication Sig Dispense Refill  . albuterol  HFA (PROVENTIL  HFA;VENTOLIN  HFA;PROAIR  HFA) 90 mcg/actuation inhaler Inhale 2 puffs every 6 (six) hours as needed.    . amLODIPine  (NORVASC ) 10 mg tablet Take 1 tablet by mouth daily.    . aspirin  81 mg chewable tablet Take 81 mg by mouth Once Daily.    . cholecalciferol  (VITAMIN  D3) 400 unit (10 mcg) tablet Take 400 Units by mouth.    . cyanocobalamin (VITAMIN B12) 1,000 mcg tablet Take 1,000 mcg by mouth daily. gummy    . dexlansoprazole (DEXILANT) 60 mg DR capsule Take 60 mg by mouth.    . dilTIAZem  (TIAZAC ) 300 mg Cs24 24 hr capsule Take 300 mg by mouth daily.    . ibuprofen  (MOTRIN ) 800 mg tablet Take 800 mg by mouth every 8 (eight) hours as needed.    . montelukast  (SINGULAIR ) 10 mg tablet Take 10 mg by mouth at bedtime.    . multivitamin-iron-minerals-folic acid (Thera-M) 27-0.4 mg tab Take 1 tablet by mouth daily.    SABRA olmesartan-hydroCHLOROthiazide  40-12.5 mg tab Take 1 tablet by mouth daily.    . oxyCODONE  (ROXICODONE ) 15 mg immediate release tablet Take 15 mg by mouth every 6 (six) hours as needed.    . phenytoin  (DILANTIN ) 100 mg ER capsule Take 200 mg by mouth daily.    . psyllium seed (METAMUCIL SUGAR FREE ORAL) Take by mouth daily. gummy    . rosuvastatin  (CRESTOR ) 20 mg tablet Take 20 mg by mouth daily.    . Symbicort 80-4.5 mcg/actuation inhaler INL 2 PFS INTO THE LUNGS BID    . tiZANidine  (ZANAFLEX ) 2 mg tablet TAKE 1 TABLET BY MOUTH EVERY 8 HOURS AS NEEDED FOR BACK PAIN OR SPASM    . topiramate (TOPAMAX) 50 mg tablet Start with 25 mg at night x 1 week, then go to 50 mg at night x 1 week, then 75 mg at night x 1 week and finally 100 mg at night 60 tablet 1  . vitamin E 180 mg (400 unit) capsule Take 400 Units by mouth daily.    . potassium chloride  (KLOR-CON ) 20 mEq ER tablet Take 1 tablet (20 mEq total) by mouth 2 (two) times a day for 10 days. 20 tablet 0   No current facility-administered medications for this visit.   Allergies  Allergen Reactions  . Strawberry Hives  . Venom-Honey Bee Anaphylaxis and Shortness Of Breath    SOB, SOB  . Penicillins Rash and Flushing    Has patient had a PCN reaction causing immediate rash, facial/tongue/throat swelling, SOB or lightheadedness with hypotension: Yes, Has patient had a PCN reaction causing severe  rash involving mucus membranes or skin necrosis: No, Has patient had a PCN reaction that required hospitalization: No, Has patient had a PCN reaction occurring within the last 10 years: No, If all of the above answers are NO, then may proceed with Cephalosporin use.  SABRA Lisinopril Itching    Vital Signs and Physical Examination:   Vitals:    Vitals:   11/29/23 1340  BP: 149/84  Pulse: 75  Weight: 101 kg (222 lb)  Height: 1.575 m (5' 2)    BP Readings from Last 10 Encounters:  11/29/23 149/84  07/04/23 (!) 164/86  06/06/23 (!) 187/104  05/24/23 (!) 142/59  04/23/23 (!) 168/84  09/16/22 (!) 138/105  03/07/22 (!) 184/79  09/27/21 ROLLEN)  176/111  10/28/19 (!) 187/94  07/30/19 (!) 150/98    Pulse Readings from Last 3 Encounters:  11/29/23 75  07/04/23 79  06/06/23 69    Wt Readings from Last 10 Encounters:  11/29/23 101 kg (222 lb)  07/04/23 99.8 kg (220 lb)  06/06/23 99.8 kg (220 lb)  04/23/23 99.8 kg (220 lb 1.6 oz)  09/16/22 99.5 kg (219 lb 6.4 oz)  03/07/22 96.8 kg (213 lb 6.4 oz)  09/27/21 95.3 kg (210 lb)  10/28/19 101 kg (222 lb 14.4 oz)  07/30/19 95.7 kg (211 lb)    Body mass index is 40.6 kg/m.   Vitals reviewed.  Constitutional:      Appearance: Normal and healthy appearance.  Eyes:     General: No scleral icterus.    Extraocular Movements: Extraocular movements intact.     Conjunctiva/sclera: Conjunctivae normal.  HENT:     Head: Normocephalic.  Neck:     Thyroid: No thyroid mass.     Vascular: No carotid bruit or JVD.     Trachea: Trachea normal.     Lymphadenopathy: No cervical adenopathy.  Pulmonary:     Effort: Pulmonary effort is normal.     Breath sounds: Normal breath sounds. No wheezing. No rales.  Chest:     Chest wall: Not tender to palpatation. No deformity.  Cardiovascular:     PMI at left midclavicular line. Normal rate. Regular rhythm. Normal S1. Normal S2.      Murmurs: There is no murmur.     No gallop.   Pulses:    Intact  distal pulses.  Edema:    Peripheral edema absent.  Abdominal:     General: Bowel sounds are normal.     Palpations: There is no abdominal mass.     Tenderness: There is no abdominal tenderness. There is no guarding.  Musculoskeletal:     Cervical back: Normal range of motion. Skin:    Coloration: Skin is not jaundiced.     Findings: No rash.  Feet:     Right foot:     Skin integrity: No ulcer or erythema.     Left foot:     Skin integrity: No ulcer or erythema.  Neurological:     General: No focal deficit present.     Mental Status: Alert and oriented to person, place, and time.     Gait: Gait is intact.  Psychiatric:        Attention and Perception: Attention normal.        Mood and Affect: Mood normal.        Speech: Speech normal.        Behavior: Behavior is cooperative.        Thought Content: Thought content normal.     Diagnostic Data and Study Review:   12-lead ECG: personally reviewed EKG Nov 29, 2023 shows sinus rhythm at 70 bpm with asymmetric T wave inversions in the inferior and lateral leads of V6 which are old and were present on September 16, 2022.  Echocardiogram: Echocardiogram January 20, 2022 shows EF 60% to 65% with no focal wall motion abnormalities.  There was mild left ventricular hypertrophy and grade 1 diastolic dysfunction with normal right ventricular size and function.  There is mild left atrial enlargement with normal cardiac valve structure and function.  Labs: Lab Results  Component Value Date   WBC 7.49 05/24/2023   HGB 13.5 05/24/2023   HCT 41.7 05/24/2023   MCV 83.4 05/24/2023   PLT  213 05/24/2023    Lab Results  Component Value Date   CREATININE 0.73 05/24/2023   BUN 11 05/24/2023   NA 135 (L) 05/24/2023   K 3.6 05/24/2023   CL 100 05/24/2023   CO2 27 05/24/2023     Troponin, High Sensitive  Date Value Ref Range Status  09/16/2022 5 <15 ng/L Final    Comment:    >= 15 ng/L INDICATES MYOCARDIAL DAMAGE.THE DIAGNOSIS OF MYOCARDIAL  INFARCTION REQUIRES CLINICAL CORRELATION.   Elevated troponin may also be due to myocardial stress from a variety of causes.   Alkaline Phos (ALP) levels >400 U/L may cause falsely elevated results. Troponin test is invalid in patients taking asfotase alpha.   This troponin assay was not validated for evaluation of troponin in patients younger than 21 years. There are no ranges established for patients younger than 21 years. Therefore, laboratory results for these patients should be  interpreted with caution.      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  She has a chronic history of hypertension that has not been ideally controlled in part due to confusion and noncompliance with medications.  She presents now with bilateral lower leg swelling and ruled out for lower extremity DVT by Doppler.  Her leg swelling is most likely iatrogenic related to the fact that she is taking 2 calcium  channel blockers 1 of which is amlodipine  10 mg a day and diltiazem  300 mg a day.  Most likely the extra increased capillary pressure induced by amlodipine  in addition to the fact that she reportedly was taking 800 mg of Motrin  3 times daily both of which have created the mild swelling in her lower extremities.  Patient has multiple medications at home and is very confused therefore at this visit we will start from scratch and identify for antihypertensives for her to take to hopefully simplify her regimen and to make sure we are all operating from the same reference point.  She was asked to discontinue taking amlodipine  10 mg a day and Motrin  800 mg 3 times daily and will continue with the following:  Losartan  100 mg once a day Maxide 37.5-25 mg once a day Diltiazem  CD 300 mg a day Coreg  6.25 mg twice daily Discontinue smoking Use CPAP regularly Ecotrin 81 mg once a day Rosuvastatin  20 mg  each night Avoid taking Motrin  800 mg 3 times daily if needed use Tylenol  500 to 1000 mg 3 times daily as needed Check blood pressure at home once in the morning and once in the evening. Bring list to next visit Follow-up visit in 1 month  I recommend that the patient follow-up with cardiology services in 1 month.  Health Maintenance issues including appropriate healthy diet, exercise and smoking avoidance were discussed with patient. Risks, benefits, and alternatives of the medications and treatment plan prescribed today were discussed, and patient expressed understanding. Plan follow-up as discussed or as needed if any worsening symptoms or change in condition.  I have personally spent 45 minutes involved in face-to-face and non-face-to-face activities for this patient on the day of the visit.    Electronically signed by: Elsie Gilmore Loreli Mickey, MD 11/29/2023 1:55 PM

## 2024-01-26 ENCOUNTER — Emergency Department (HOSPITAL_BASED_OUTPATIENT_CLINIC_OR_DEPARTMENT_OTHER)
Admission: EM | Admit: 2024-01-26 | Discharge: 2024-01-26 | Disposition: A | Discharge status: Home / Self Care | Attending: Emergency Medicine | Admitting: Emergency Medicine

## 2024-01-26 ENCOUNTER — Encounter (HOSPITAL_BASED_OUTPATIENT_CLINIC_OR_DEPARTMENT_OTHER): Payer: Self-pay

## 2024-01-26 ENCOUNTER — Emergency Department (HOSPITAL_BASED_OUTPATIENT_CLINIC_OR_DEPARTMENT_OTHER)

## 2024-01-26 ENCOUNTER — Other Ambulatory Visit: Payer: Self-pay

## 2024-01-26 DIAGNOSIS — Z7982 Long term (current) use of aspirin: Secondary | ICD-10-CM | POA: Insufficient documentation

## 2024-01-26 DIAGNOSIS — Z79899 Other long term (current) drug therapy: Secondary | ICD-10-CM | POA: Diagnosis not present

## 2024-01-26 DIAGNOSIS — E119 Type 2 diabetes mellitus without complications: Secondary | ICD-10-CM | POA: Insufficient documentation

## 2024-01-26 DIAGNOSIS — I10 Essential (primary) hypertension: Secondary | ICD-10-CM | POA: Insufficient documentation

## 2024-01-26 DIAGNOSIS — M25562 Pain in left knee: Secondary | ICD-10-CM | POA: Insufficient documentation

## 2024-01-26 IMAGING — CR DG CHEST 2V
2 series · 2 of 2 positions shown · non-contrast
Comparison: 05/19/2021.

CLINICAL DATA: Chest pain.

EXAM:
CHEST - 2 VIEW

[chest lat]
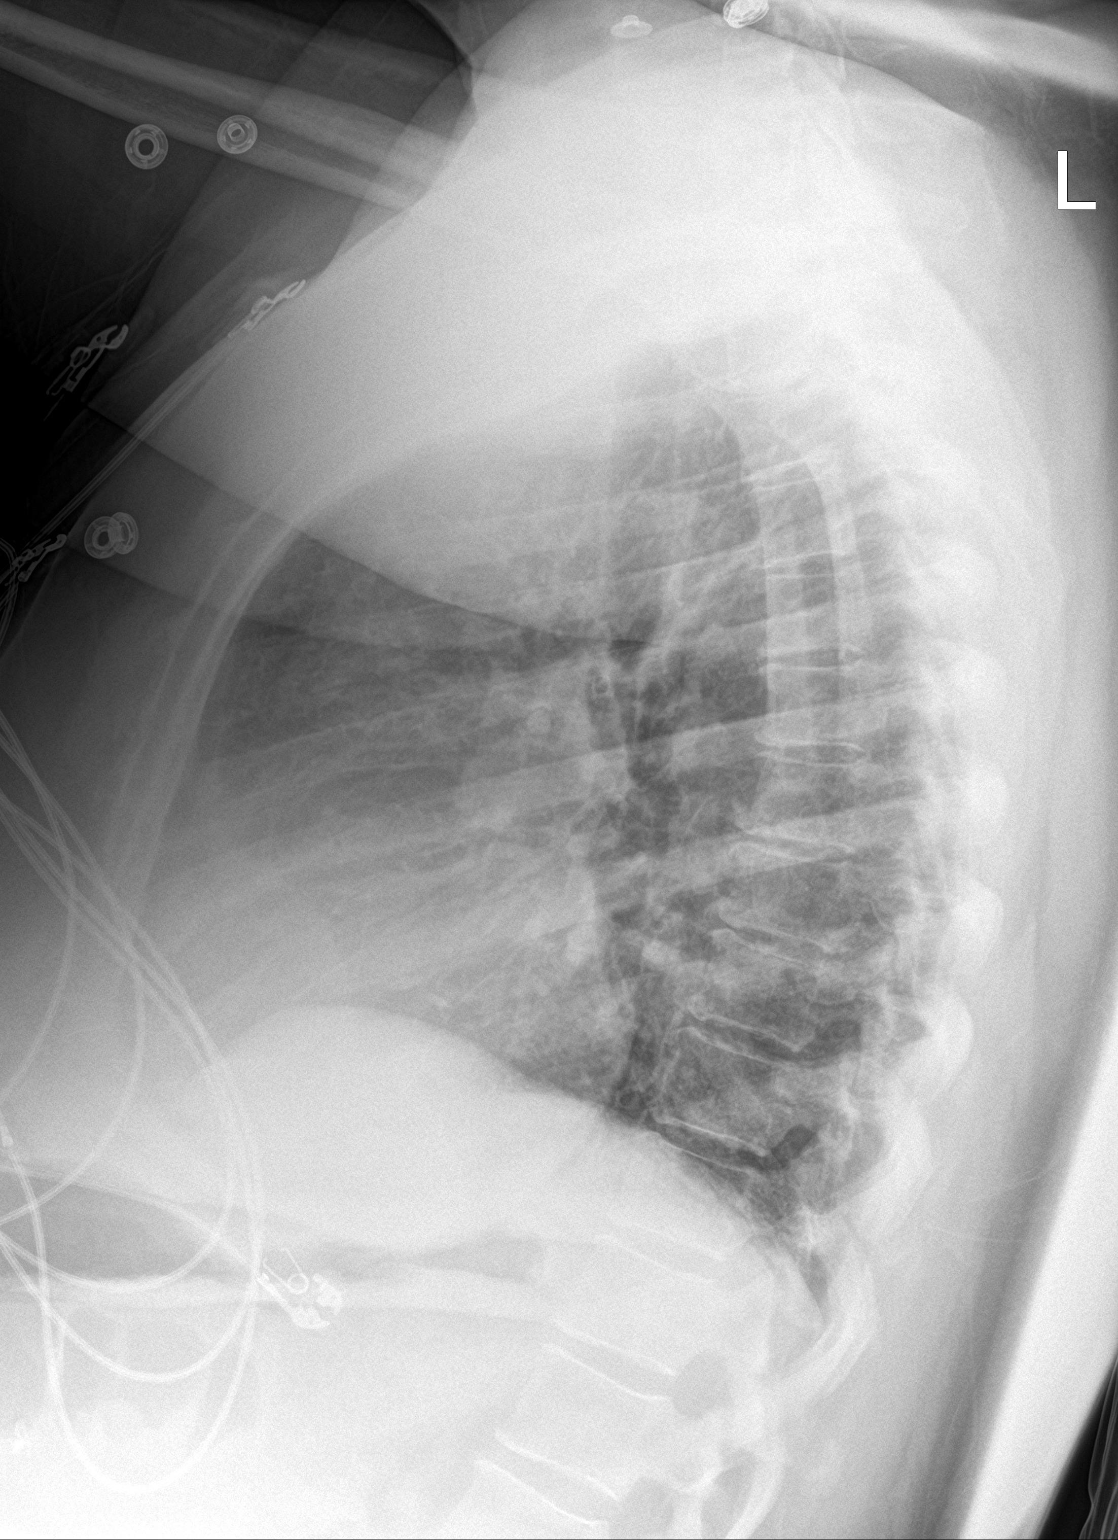

[chest ap]
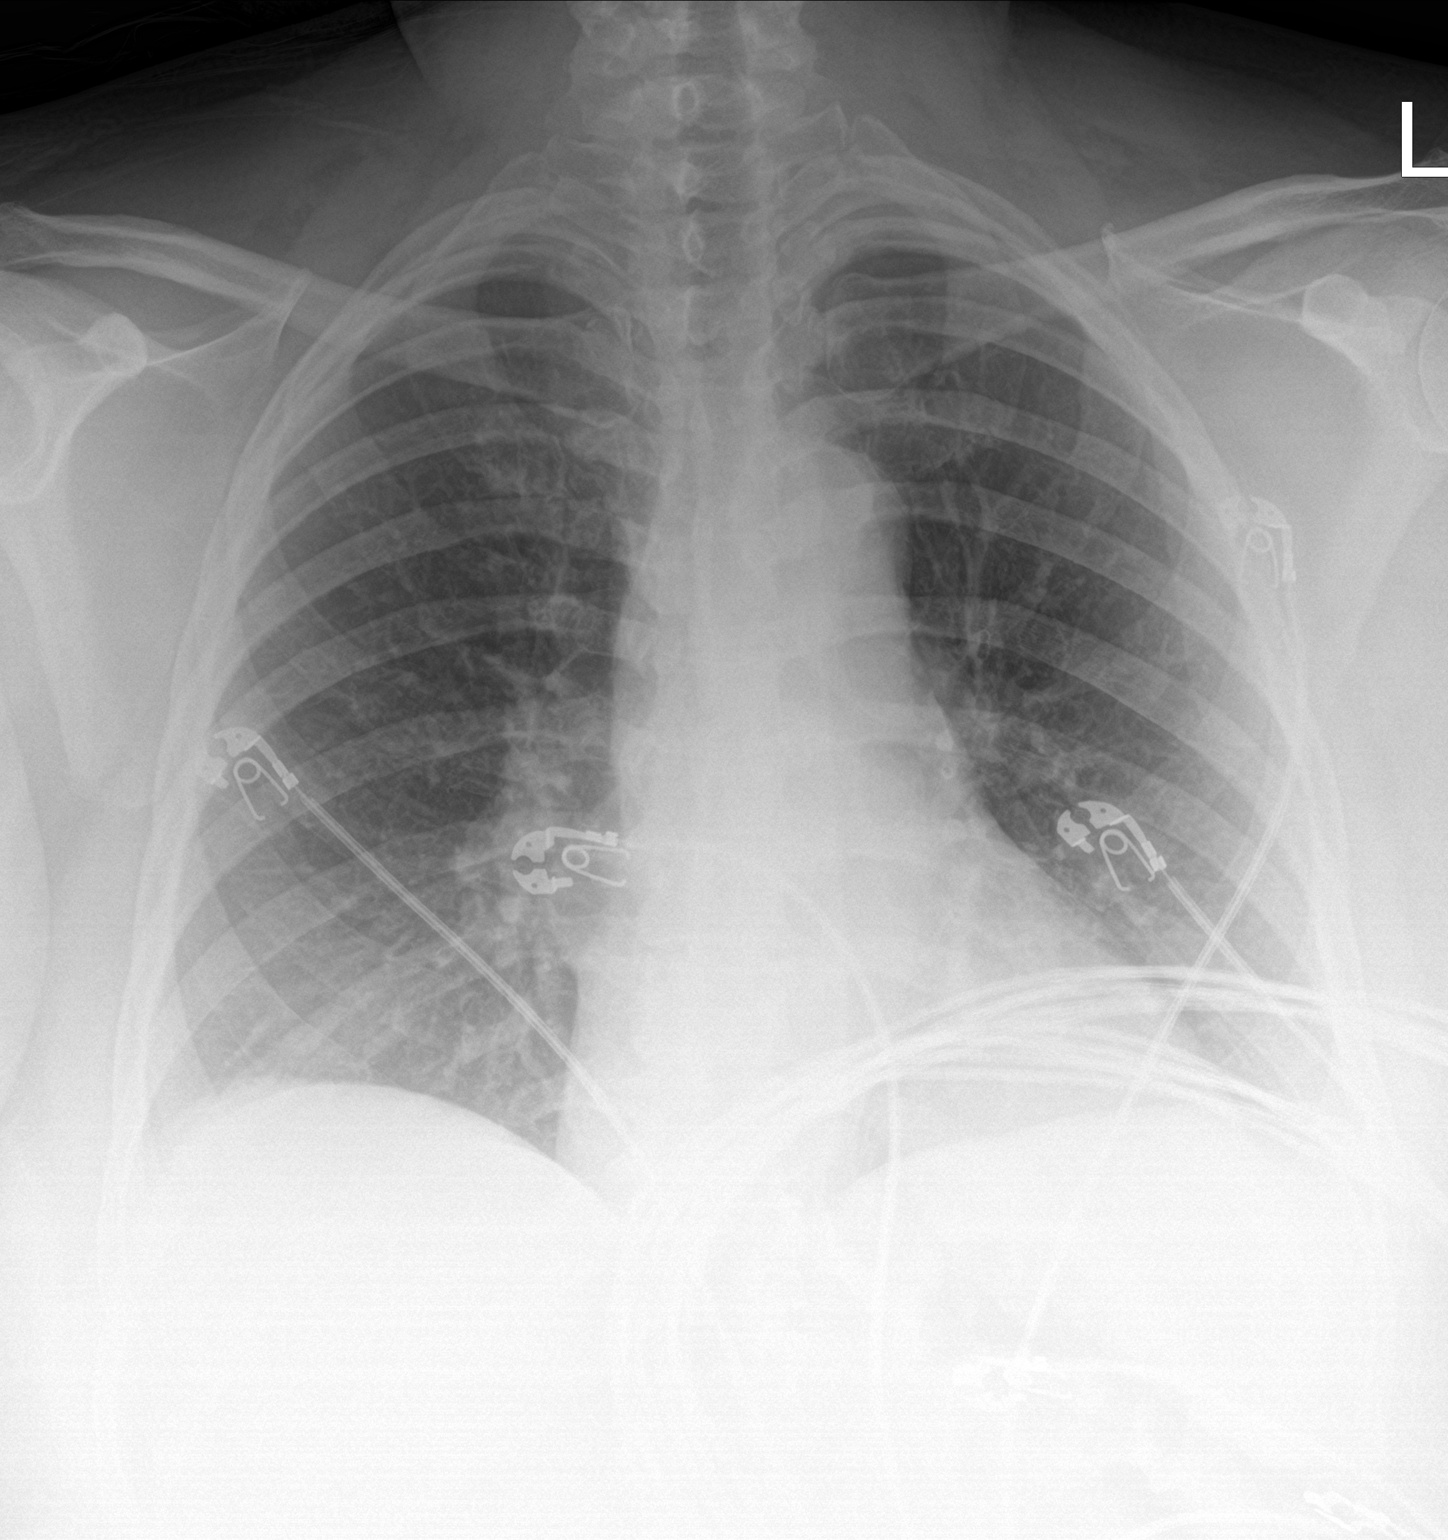

[2 of 2 positions shown; findings below may reference images not displayed]

FINDINGS: The heart size and mediastinal contours are within normal limits.
There is minimal atelectasis at the right lung base. No effusion or
pneumothorax. No acute osseous abnormality.
IMPRESSION: Minimal atelectasis at the right lung base.

## 2024-01-26 MED ORDER — OXYCODONE HCL 5 MG PO TABS
5.0000 mg | ORAL_TABLET | Freq: Once | ORAL | Status: AC
  Administered 2024-01-26: 5 mg via ORAL
  Filled 2024-01-26: qty 1

## 2024-01-26 MED ORDER — METHYLPREDNISOLONE 4 MG PO TBPK: ORAL_TABLET | ORAL | 0 refills | Status: AC

## 2024-01-26 NOTE — ED Notes (Signed)
 Pt alert and oriented X 4 at the time of discharge. RR even and unlabored. No acute distress noted. Pt verbalized understanding of discharge instructions as discussed. Pt transport in wheelchair to lobby at time of discharge.

## 2024-01-26 NOTE — Discharge Instructions (Signed)
 Continue your knee sleeve, cane.  Start Medrol  Dosepak as prescribed.  Follow-up with orthopedics.

## 2024-01-26 NOTE — ED Triage Notes (Signed)
 Pt reports left knee pain & swelling  X 3 days. Denies calf pain or other symptoms.

## 2024-01-26 NOTE — ED Provider Notes (Signed)
 Gloria Hurst Provider Note   CSN: [id number] Arrival date & time: 01/26/24  1009     Patient presents with: Knee Pain   Gloria Hurst is a 63 y.o. female.   Patient here with left anterior knee pain for the last few days.  History of the same.  She has been using topical pain medicine and knee sleeve and cane.  She denies any trauma.  Denies any weakness numbness tingling.  Denies any calf pain.  The history is provided by the patient.       Prior to Admission medications   Medication Sig Start Date End Date Taking? Authorizing Provider  methylPREDNISolone  (MEDROL  DOSEPAK) 4 MG TBPK tablet Follow package insert 01/26/24  Yes Samamtha Tiegs, DO  acetaZOLAMIDE  (DIAMOX ) 250 MG tablet Take 2 tablets (500 mg total) by mouth 2 (two) times daily. 01/26/22 04/26/22  Arlice Reichert, MD  albuterol  (PROVENTIL  HFA) 108 (90 Base) MCG/ACT inhaler Inhale 2 puffs into the lungs every 6 (six) hours as needed for wheezing or shortness of breath.     [provider]  aspirin  EC 81 MG EC tablet Take 1 tablet (81 mg total) by mouth daily. 01/08/18   Singh, Prashant K, MD  budesonide-formoterol  [facility]) 80-4.5 MCG/ACT inhaler Inhale 2 puffs into the lungs at bedtime.    [provider]  cholecalciferol  (VITAMIN D) 400 units TABS tablet Take 400 Units by mouth daily.    [provider]  dexlansoprazole (DEXILANT) 60 MG capsule Take 60 mg by mouth daily.    [provider]  diltiazem  (CARDIZEM  CD) 180 MG 24 hr capsule Take 1 capsule (180 mg total) by mouth daily. 01/27/22 04/27/22  Arlice Reichert, MD  furosemide  (LASIX ) 20 MG tablet One tablet a day 10/25/23   Sofia, Leslie K, PA-C  losartan  (COZAAR ) 100 MG tablet Take 100 mg by mouth daily. 11/02/21   [provider]  montelukast  (SINGULAIR ) 10 MG tablet Take 10 mg by mouth at bedtime.    [provider]  Multiple Vitamin (MULTIVITAMIN WITH MINERALS) TABS tablet  Take 1 tablet by mouth daily. Centrum    [provider]  potassium chloride  SA (KLOR-CON  M) 20 MEQ tablet Take 1 tablet (20 mEq total) by mouth daily. 10/25/23   Sofia, Leslie K, PA-C  PRESCRIPTION MEDICATION Inhale into the lungs at bedtime. CPAP    [provider]  rosuvastatin  (CRESTOR ) 20 MG tablet Take 1 tablet (20 mg total) by mouth daily. 01/07/18   Singh, Prashant K, MD    Allergies: Bee venom, Strawberry extract, and Penicillins    Review of Systems  Updated Vital Signs BP (!) 172/75 (BP Location: Right Arm)   Pulse (!) 58   Temp 98.6 F (37 C) (Oral)   Resp 20   LMP 03/26/2012   SpO2 98%   Physical Exam Vitals and nursing note reviewed.  Constitutional:      General: She is not in acute distress.    Appearance: She is well-developed.  HENT:     Head: Normocephalic and atraumatic.  Eyes:     Conjunctiva/sclera: Conjunctivae normal.  Cardiovascular:     Rate and Rhythm: Normal rate and regular rhythm.     Pulses: Normal pulses.     Heart sounds: No murmur heard. Pulmonary:     Effort: Pulmonary effort is normal. No respiratory distress.     Breath sounds: Normal breath sounds.  Abdominal:     Palpations: Abdomen is soft.  Tenderness: There is no abdominal tenderness.  Musculoskeletal:        General: Tenderness present. No swelling.     Cervical back: Neck supple.     Comments: Tenderness to the anterior portion of the left knee.,  No major swelling, good range of motion but with discomfort  Skin:    General: Skin is warm and dry.     Capillary Refill: Capillary refill takes less than 2 seconds.  Neurological:     General: No focal deficit present.     Mental Status: She is alert.     Sensory: No sensory deficit.     Motor: No weakness.  Psychiatric:        Mood and Affect: Mood normal.     (all labs ordered are listed, but only abnormal results are displayed) Labs Reviewed - No data to display  EKG: None  Radiology: DG Knee  Complete 4 Views Left Result Date: 01/26/2024 CLINICAL DATA:  Left knee pain. EXAM: LEFT KNEE - COMPLETE 4+ VIEW COMPARISON:  None Available. FINDINGS: No acute fracture or dislocation. The bones are well mineralized. No significant arthritic changes. No joint effusion. The soft tissues are unremarkable. IMPRESSION: Negative. Electronically Signed   By: Vanetta Chou M.D.   On: 01/26/2024 11:32     Procedures   Medications Ordered in the ED  oxyCODONE  (Oxy IR/ROXICODONE ) immediate release tablet 5 mg (5 mg Oral Given 01/26/24 1122)                                    Medical Decision Making Amount and/or Complexity of Data Reviewed Radiology: ordered.  Risk Prescription drug management.   Bentleigh Hurst is here with left knee pain.  History of epilepsy hypertension high cholesterol diabetes.  Overall differential diagnosis likely arthritis/inflammatory process.  Have no concern for septic joint.  No fever.  Has no major swelling on exam.  No trauma history.  Will get x-ray to evaluate for fracture or malalignment effusion.  She is already has a knee sleeve and cane.  Will likely do Medrol  Dosepak and have her follow-up with orthopedics.  She is neurovascular mostly intact on exam otherwise.  X-ray per radiology report shows no acute findings.  Overall I suspect arthritis/inflammatory type process.  May be a soft tissue injury.  Will continue Medrol  Dosepak at home.  Knee sleeve cane and follow-up with orthopedics.  Discharge.  This chart was dictated using voice recognition software.  Despite best efforts to proofread,  errors can occur which can change the documentation meaning.      Final diagnoses:  Acute pain of left knee    ED Discharge Orders          Ordered    methylPREDNISolone  (MEDROL  DOSEPAK) 4 MG TBPK tablet        01/26/24 1137               [name], DO 01/26/24 1137

## 2024-06-12 ENCOUNTER — Emergency Department (HOSPITAL_BASED_OUTPATIENT_CLINIC_OR_DEPARTMENT_OTHER)

## 2024-06-12 ENCOUNTER — Emergency Department (HOSPITAL_BASED_OUTPATIENT_CLINIC_OR_DEPARTMENT_OTHER)
Admission: EM | Admit: 2024-06-12 | Discharge: 2024-06-12 | Disposition: A | Discharge status: Home / Self Care | Attending: Emergency Medicine | Admitting: Emergency Medicine

## 2024-06-12 ENCOUNTER — Encounter (HOSPITAL_BASED_OUTPATIENT_CLINIC_OR_DEPARTMENT_OTHER): Payer: Self-pay

## 2024-06-12 ENCOUNTER — Other Ambulatory Visit: Payer: Self-pay

## 2024-06-12 DIAGNOSIS — Z7982 Long term (current) use of aspirin: Secondary | ICD-10-CM | POA: Insufficient documentation

## 2024-06-12 DIAGNOSIS — R079 Chest pain, unspecified: Secondary | ICD-10-CM | POA: Insufficient documentation

## 2024-06-12 DIAGNOSIS — R0602 Shortness of breath: Secondary | ICD-10-CM | POA: Insufficient documentation

## 2024-06-12 DIAGNOSIS — Z79899 Other long term (current) drug therapy: Secondary | ICD-10-CM | POA: Diagnosis not present

## 2024-06-12 DIAGNOSIS — K1120 Sialoadenitis, unspecified: Secondary | ICD-10-CM | POA: Diagnosis not present

## 2024-06-12 DIAGNOSIS — M542 Cervicalgia: Secondary | ICD-10-CM | POA: Diagnosis present

## 2024-06-12 LAB — BASIC METABOLIC PANEL WITH GFR
Anion gap: 10 (ref 5–15)
BUN: 11 mg/dL (ref 8–23)
CO2: 25 mmol/L (ref 22–32)
Calcium: 9.1 mg/dL (ref 8.9–10.3)
Chloride: 100 mmol/L (ref 98–111)
Creatinine, Ser: 0.65 mg/dL (ref 0.44–1.00)
GFR, Estimated: >60 mL/min (ref >60)
Glucose, Bld: 122 mg/dL — ABNORMAL HIGH (ref 70–99)
Potassium: 3.9 mmol/L (ref 3.5–5.1)
Sodium: 136 mmol/L (ref 135–145)

## 2024-06-12 LAB — CBC
HCT: 41.2 % (ref 36.0–46.0)
Hemoglobin: 13.7 g/dL (ref 12.0–15.0)
MCH: 26.9 pg (ref 26.0–34.0)
MCHC: 33.3 g/dL (ref 30.0–36.0)
MCV: 80.9 fL (ref 80.0–100.0)
Platelets: 232 K/uL (ref 150–400)
RBC: 5.09 MIL/uL (ref 3.87–5.11)
RDW: 14.8 % (ref 11.5–15.5)
WBC: 11.7 K/uL — ABNORMAL HIGH (ref 4.0–10.5)
nRBC: 0 % (ref 0.0–0.2)

## 2024-06-12 LAB — RESP PANEL BY RT-PCR (RSV, FLU A&B, COVID)  RVPGX2
Influenza A by PCR: NEGATIVE
Influenza B by PCR: NEGATIVE
Resp Syncytial Virus by PCR: NEGATIVE
SARS Coronavirus 2 by RT PCR: NEGATIVE

## 2024-06-12 LAB — TROPONIN T, HIGH SENSITIVITY
Troponin T High Sensitivity: 16 ng/L (ref 0–19)
Troponin T High Sensitivity: <15 ng/L (ref 0–19)

## 2024-06-12 LAB — CBG MONITORING, ED: Glucose-Capillary: 128 mg/dL — ABNORMAL HIGH (ref 70–99)

## 2024-06-12 LAB — GROUP A STREP BY PCR: Group A Strep by PCR: NOT DETECTED

## 2024-06-12 MED ORDER — CLINDAMYCIN HCL 150 MG PO CAPS
300.0000 mg | ORAL_CAPSULE | Freq: Once | ORAL | Status: AC
  Administered 2024-06-12: 300 mg via ORAL
  Filled 2024-06-12: qty 2

## 2024-06-12 MED ORDER — KETOROLAC TROMETHAMINE 15 MG/ML IJ SOLN
15.0000 mg | Freq: Once | INTRAMUSCULAR | Status: AC
  Administered 2024-06-12: 15 mg via INTRAVENOUS
  Filled 2024-06-12: qty 1

## 2024-06-12 MED ORDER — MORPHINE SULFATE (PF) 2 MG/ML IV SOLN
2.0000 mg | Freq: Once | INTRAVENOUS | Status: AC
  Administered 2024-06-12: 2 mg via INTRAVENOUS
  Filled 2024-06-12: qty 1

## 2024-06-12 MED ORDER — HYDROCODONE-ACETAMINOPHEN 5-325 MG PO TABS
1.0000 | ORAL_TABLET | Freq: Once | ORAL | Status: AC
  Administered 2024-06-12: 1 via ORAL
  Filled 2024-06-12: qty 1

## 2024-06-12 MED ORDER — IOHEXOL 300 MG/ML  SOLN
75.0000 mL | Freq: Once | INTRAMUSCULAR | Status: AC | PRN
  Administered 2024-06-12: 75 mL via INTRAVENOUS

## 2024-06-12 MED ORDER — ONDANSETRON HCL 4 MG/2ML IJ SOLN
4.0000 mg | Freq: Once | INTRAMUSCULAR | Status: AC
  Administered 2024-06-12: 4 mg via INTRAVENOUS
  Filled 2024-06-12: qty 2

## 2024-06-12 NOTE — Discharge Instructions (Addendum)
 You were seen for your infected salivary gland (sialadenitis/parotitis) in the emergency department.   At home, please use sour lozenges, lemon drops, or lemon juice to increase salivation to help pass the stone.  Take the antibiotics (clindamycin) you were prescribed.  Check your MyChart online for the results of any tests that had not resulted by the time you left the emergency department.   Follow-up with your primary doctor in 2-3 days regarding your visit.  Follow-up with the ENT doctors in 1 to 2 weeks if your symptoms do not improve.  Return immediately to the emergency department if you experience any of the following: Worsening pain, high fevers, inability to swallow, difficulty breathing, or any other concerning symptoms.    Thank you for visiting our Emergency Department. It was a pleasure taking care of you today.

## 2024-06-12 NOTE — ED Triage Notes (Addendum)
 Pt accompanied by daughter. Reports left sided facial pain and swelling since 4 am radiating to neck. Too painful to open mouth. CP and dizziness since 7am. No problems prior to bed last night.

## 2024-06-12 NOTE — ED Provider Notes (Signed)
 [place] [facility] Provider Note   CSN: [id number] Arrival date & time: 06/12/24  9242     Patient presents with: Chest Pain and Facial Pain   Gloria Hurst is a 63 y.o. female.   63 year old female history of hypertension seizures who presents emergency department mouth and neck pain.  At 4 AM was woken up with pain in the left side of her neck.  Says it feels like it may be originating from her mouth.  Hurts to move her mouth and talk.  Several hours later started developing shortness of breath as well as some chest pain.  Has difficulty characterizing these.  Is accompanied by her daughter.  Has not yet taken any pain medication       Prior to Admission medications  Medication Sig Start Date End Date Taking? Authorizing Provider  acetaZOLAMIDE  (DIAMOX ) 250 MG tablet Take 2 tablets (500 mg total) by mouth 2 (two) times daily. 01/26/22 04/26/22  Arlice Reichert, MD  albuterol  (PROVENTIL  HFA) 108 (90 Base) MCG/ACT inhaler Inhale 2 puffs into the lungs every 6 (six) hours as needed for wheezing or shortness of breath.     [provider]  aspirin  EC 81 MG EC tablet Take 1 tablet (81 mg total) by mouth daily. 01/08/18   Singh, Prashant K, MD  budesonide-formoterol  [facility]) 80-4.5 MCG/ACT inhaler Inhale 2 puffs into the lungs at bedtime.    [provider]  cholecalciferol  (VITAMIN D) 400 units TABS tablet Take 400 Units by mouth daily.    [provider]  dexlansoprazole (DEXILANT) 60 MG capsule Take 60 mg by mouth daily.    [provider]  diltiazem  (CARDIZEM  CD) 180 MG 24 hr capsule Take 1 capsule (180 mg total) by mouth daily. 01/27/22 04/27/22  Arlice Reichert, MD  furosemide  (LASIX ) 20 MG tablet One tablet a day 10/25/23   Sofia, Leslie K, PA-C  losartan  (COZAAR ) 100 MG tablet Take 100 mg by mouth daily. 11/02/21   [provider]  methylPREDNISolone  (MEDROL  DOSEPAK) 4 MG TBPK tablet Follow package insert  01/26/24   Curatolo, Adam, DO  montelukast  (SINGULAIR ) 10 MG tablet Take 10 mg by mouth at bedtime.    [provider]  Multiple Vitamin (MULTIVITAMIN WITH MINERALS) TABS tablet Take 1 tablet by mouth daily. Centrum    [provider]  potassium chloride  SA (KLOR-CON  M) 20 MEQ tablet Take 1 tablet (20 mEq total) by mouth daily. 10/25/23   Sofia, Leslie K, PA-C  PRESCRIPTION MEDICATION Inhale into the lungs at bedtime. CPAP    [provider]  rosuvastatin  (CRESTOR ) 20 MG tablet Take 1 tablet (20 mg total) by mouth daily. 01/07/18   Singh, Prashant K, MD    Allergies: Bee venom, Strawberry extract, Dexamethasone, and Penicillins    Review of Systems  Updated Vital Signs BP 139/73   Pulse (!) 58   Temp 98.2 F (36.8 C)   Resp 16   Wt 100.2 kg   LMP 03/26/2012   SpO2 97%   BMI 40.42 kg/m   Physical Exam Vitals and nursing note reviewed.  Constitutional:      General: She is not in acute distress.    Appearance: She is well-developed. She is obese.  HENT:     Head: Normocephalic and atraumatic.     Right Ear: Tympanic membrane, ear canal and external ear normal.     Left Ear: Tympanic membrane, ear canal and external ear normal.     Nose:  Nose normal.     Mouth/Throat:     Mouth: Mucous membranes are moist.     Pharynx: Oropharynx is clear.     Comments: Molars on the left side have been removed.  Wearing dentures on her upper teeth.  No fluctuance.  No sialoliths appreciated.  No brawny edema under the tongue.  Uvula is midline. Eyes:     Extraocular Movements: Extraocular movements intact.     Conjunctiva/sclera: Conjunctivae normal.     Pupils: Pupils are equal, round, and reactive to light.  Neck:     Comments: Tenderness to palpation near the angle of the mandible on the left side of the neck.  Habitus somewhat limits evaluation for masses Cardiovascular:     Rate and Rhythm: Normal rate and regular rhythm.     Heart sounds: No murmur heard.     Comments: Radial pulses 2+ bilaterally Pulmonary:     Effort: Pulmonary effort is normal. No respiratory distress.     Breath sounds: Normal breath sounds.  Musculoskeletal:     Cervical back: Normal range of motion and neck supple.  Skin:    General: Skin is warm and dry.  Neurological:     Mental Status: She is alert and oriented to person, place, and time. Mental status is at baseline.     Cranial Nerves: No cranial nerve deficit.  Psychiatric:        Mood and Affect: Mood normal.     (all labs ordered are listed, but only abnormal results are displayed) Labs Reviewed  BASIC METABOLIC PANEL WITH GFR - Abnormal; Notable for the following components:      Result Value   Glucose, Bld 122 (*)    All other components within normal limits  CBC - Abnormal; Notable for the following components:   WBC 11.7 (*)    All other components within normal limits  CBG MONITORING, ED - Abnormal; Notable for the following components:   Glucose-Capillary 128 (*)    All other components within normal limits  GROUP A STREP BY PCR  RESP PANEL BY RT-PCR (RSV, FLU A&B, COVID)  [id number]  TROPONIN T, HIGH SENSITIVITY  TROPONIN T, HIGH SENSITIVITY    EKG: EKG Interpretation Date/Time:  Friday June 12 2024 08:08:22 EST Ventricular Rate:  73 PR Interval:  143 QRS Duration:  83 QT Interval:  395 QTC Calculation: 436 R Axis:   56  Text Interpretation: Sinus rhythm Probable left atrial enlargement Borderline T wave abnormalities Confirmed by Yolande Charleston [phone]) on 06/12/2024 9:24:18 AM  Radiology: [facility] Chest 2 View Result Date: 06/12/2024 CLINICAL DATA:  Chest pain, shortness of breath. EXAM: CHEST - 2 VIEW COMPARISON:  October 25, 2023. FINDINGS: The heart size and mediastinal contours are within normal limits. Both lungs are clear. The visualized skeletal structures are unremarkable. IMPRESSION: No active cardiopulmonary disease. Electronically Signed   By: Lynwood Landy Raddle M.D.   On:  06/12/2024 11:23   CT Soft Tissue Neck W Contrast Result Date: 06/12/2024 EXAM: CT NECK WITH CONTRAST 06/12/2024 10:31:00 AM TECHNIQUE: CT of the neck was performed with the administration of 75 mL of iohexol  (OMNIPAQUE ) 300 MG/ML solution. Multiplanar reformatted images are provided for review. Automated exposure control, iterative reconstruction, and/or weight based adjustment of the mA/kV was utilized to reduce the radiation dose to as low as reasonably achievable. COMPARISON: CTA head and neck 01/22/2022. CLINICAL HISTORY: L neck pain. FINDINGS: AERODIGESTIVE TRACT: No discrete mass. No edema. SALIVARY GLANDS: There is mild asymmetric enlargement  of the left parotid tail with adjacent fat infiltration and thickening of the overlying platysma muscle. There is a punctate stone in the region of the left floor of mouth (series 3, image 65). The right parotid and bilateral submandibular glands are unremarkable. THYROID: Unremarkable. LYMPH NODES: No suspicious cervical lymphadenopathy. SOFT TISSUES: No mass or fluid collection. BRAIN, ORBITS, SINUSES AND MASTOIDS: No acute abnormality. LUNGS AND MEDIASTINUM: No acute abnormality. BONES: No focal bone abnormality. There is scattered mucosal thickening of the bilateral ethmoid and maxillary paranasal sinuses. Trace left mastoid effusion. IMPRESSION: 1. Mild asymmetric enlargement of the left parotid tail with adjacent fat infiltration and platysma thickening, compatible with parotitis or sialadenitis. 2. Punctate stone in the left floor of mouth, likely sialolithiasis. Electronically signed by: Prentice Spade 06/12/2024 11:06 AM EST RP Workstation: [id number]     Procedures   Medications Ordered in the ED  morphine (PF) 2 MG/ML injection 2 mg (2 mg Intravenous Given 06/12/24 0854)  ondansetron  (ZOFRAN ) injection 4 mg (4 mg Intravenous Given 06/12/24 0853)  ketorolac (TORADOL) 15 MG/ML injection 15 mg (15 mg Intravenous Given 06/12/24 0852)  iohexol   (OMNIPAQUE ) 300 MG/ML solution 75 mL (75 mLs Intravenous Contrast Given 06/12/24 1004)  HYDROcodone -acetaminophen  (NORCO/VICODIN) 5-325 MG per tablet 1 tablet (1 tablet Oral Given 06/12/24 1109)  clindamycin (CLEOCIN) capsule 300 mg (300 mg Oral Given 06/12/24 1148)                                    Medical Decision Making Amount and/or Complexity of Data Reviewed Labs: ordered. Radiology: ordered.  Risk Prescription drug management.   Gloria Hurst is a 63 year old female history of hypertension seizures who presents emergency department mouth and neck pain.   Initial Ddx:  Dental infection, peritonsillar abscess, sialolithiasis, lymphadenopathy, strep throat, MI, pneumonia, URI, pharyngitis  MDM/Course:  Patient presents emergency department with pain along the side of her mouth on the left side.  Says it is radiating down to her chest and short of breath.  She appears quite anxious.  Pain is reproducible in her throat and jaw.  No signs of a dental infection.  Due to her habitus having difficult time ascertaining whether or not she has any swollen lymph nodes or salivary glands.  No concerns for airway compromise at this point in time.  Her lungs are clear to auscultation bilaterally.  She is not septic and is satting well on room air.  CT scan of the neck does show that she has parotitis and potentially a sublingual salivary stone as well.  Chest x-ray without pneumonia.  COVID and flu and strep negative.  Serial troponins WNL x 2.  Upon re-evaluation pain was improved.  Discharged home with clindamycin and instructions to use sour candies to help with salivation.  Also instructed on how to perform parotid gland massages and to follow-up with ENT.  I suspect her chest pain and shortness of breath were likely due to stress and anxiety from the sialoadenitis  This patient presents to the ED for concern of complaints listed in HPI, this involves an extensive number of treatment options,  and is a complaint that carries with it a high risk of complications and morbidity. Disposition including potential need for admission considered.   Dispo: DC Home. Return precautions discussed including, but not limited to, those listed in the AVS. Allowed pt time to ask questions which were answered fully prior to dc.  Additional history obtained from daughter Records reviewed Outpatient Clinic Notes The following labs were independently interpreted: Chemistry and show no acute abnormality I independently reviewed the following imaging with scope of interpretation limited to determining acute life threatening conditions related to emergency care: Chest x-ray and agree with the radiologist interpretation with the following exceptions: none I personally reviewed and interpreted cardiac monitoring: normal sinus rhythm  I personally reviewed and interpreted the pt's EKG: see above for interpretation  I have reviewed the patients home medications and made adjustments as needed Social Determinants of health:  Geriatric  Portions of this note were generated with Scientist, clinical (histocompatibility and immunogenetics). Dictation errors may occur despite best attempts at proofreading.     Final diagnoses:  Parotitis  Chest pain, unspecified type    ED Discharge Orders     None          Yolande Lamar BROCKS, MD 06/12/24 1159

## 2024-06-13 ENCOUNTER — Telehealth (HOSPITAL_BASED_OUTPATIENT_CLINIC_OR_DEPARTMENT_OTHER): Payer: Self-pay | Admitting: Emergency Medicine

## 2024-06-13 MED ORDER — CLINDAMYCIN HCL 300 MG PO CAPS: 300.0000 mg | ORAL_CAPSULE | Freq: Three times a day (TID) | ORAL | 0 refills | Status: AC

## 2024-06-13 NOTE — Telephone Encounter (Cosign Needed)
 Patient did not get ABX sent in , called for request. Seen last  night and dx w parotitis

## 2024-06-17 ENCOUNTER — Institutional Professional Consult (permissible substitution) (INDEPENDENT_AMBULATORY_CARE_PROVIDER_SITE_OTHER): Admitting: Otolaryngology
